# Patient Record
Sex: Female | Born: 1990
Health system: Southern US, Community
[De-identification: ages and names within clinical notes are randomized; demographics above are authoritative.]

## PROBLEM LIST (undated history)

## (undated) HISTORY — PX: TONSILLECTOMY: SUR1361

---
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
## 2011-09-28 ENCOUNTER — Ambulatory Visit (HOSPITAL_COMMUNITY)
Admission: RE | Admit: 2011-09-28 | Discharge: 2011-09-28 | Disposition: A | Discharge status: Home / Self Care | Payer: BC Managed Care – PPO | Source: Ambulatory Visit | Attending: Obstetrics | Admitting: Obstetrics

## 2011-09-28 ENCOUNTER — Other Ambulatory Visit: Payer: Self-pay | Admitting: Obstetrics

## 2011-09-28 DIAGNOSIS — O3680X0 Pregnancy with inconclusive fetal viability, not applicable or unspecified: Secondary | ICD-10-CM

## 2011-09-28 DIAGNOSIS — O368390 Maternal care for abnormalities of the fetal heart rate or rhythm, unspecified trimester, not applicable or unspecified: Secondary | ICD-10-CM | POA: Insufficient documentation

## 2011-09-28 LAB — OB RESULTS CONSOLE ABO/RH

## 2011-09-28 LAB — OB RESULTS CONSOLE HIV ANTIBODY (ROUTINE TESTING): HIV: NONREACTIVE

## 2011-10-01 ENCOUNTER — Other Ambulatory Visit: Payer: Self-pay | Admitting: Obstetrics

## 2011-10-01 DIAGNOSIS — O3680X0 Pregnancy with inconclusive fetal viability, not applicable or unspecified: Secondary | ICD-10-CM

## 2011-10-12 ENCOUNTER — Ambulatory Visit (HOSPITAL_COMMUNITY)
Admission: RE | Admit: 2011-10-12 | Discharge: 2011-10-12 | Disposition: A | Discharge status: Home / Self Care | Payer: BC Managed Care – PPO | Source: Ambulatory Visit | Attending: Obstetrics | Admitting: Obstetrics

## 2011-10-12 DIAGNOSIS — O3680X0 Pregnancy with inconclusive fetal viability, not applicable or unspecified: Secondary | ICD-10-CM | POA: Insufficient documentation

## 2011-10-12 DIAGNOSIS — O26849 Uterine size-date discrepancy, unspecified trimester: Secondary | ICD-10-CM | POA: Insufficient documentation

## 2012-04-20 NOTE — L&D Delivery Note (Signed)
Delivery Note At 9:51 AM a viable female was delivered via Vaginal, Spontaneous Delivery (Presentation: Left Occiput Anterior).  APGAR: , ; weight 7 lb 0.4 oz (3185 g).   Placenta status: Intact, Spontaneous.  Cord: 3 vessels with the following complications: None.  Cord pH: not done  Anesthesia: Epidural  Episiotomy: None Lacerations: None Suture Repair: 2.0 Est. Blood Loss (mL): 250  Mom to postpartum.  Baby to nursery-stable.  Shemicka Cohrs A 05/15/2012, 10:03 AM

## 2012-04-25 LAB — OB RESULTS CONSOLE GC/CHLAMYDIA: Gonorrhea: NEGATIVE

## 2012-05-03 LAB — OB RESULTS CONSOLE GBS: GBS: POSITIVE

## 2012-05-14 ENCOUNTER — Inpatient Hospital Stay (HOSPITAL_COMMUNITY)
Admission: AD | Admit: 2012-05-14 | Discharge: 2012-05-17 | DRG: 373 | Disposition: A | Discharge status: Home / Self Care | Payer: BC Managed Care – PPO | Source: Ambulatory Visit | Attending: Obstetrics & Gynecology | Admitting: Obstetrics & Gynecology

## 2012-05-14 ENCOUNTER — Encounter (HOSPITAL_COMMUNITY): Payer: Self-pay | Admitting: *Deleted

## 2012-05-14 DIAGNOSIS — O99892 Other specified diseases and conditions complicating childbirth: Principal | ICD-10-CM | POA: Diagnosis present

## 2012-05-14 DIAGNOSIS — Z22330 Carrier of Group B streptococcus: Secondary | ICD-10-CM

## 2012-05-14 NOTE — MAU Note (Signed)
I've been contracting since 1800. No bleeding or leaking

## 2012-05-15 ENCOUNTER — Encounter (HOSPITAL_COMMUNITY): Payer: Self-pay | Admitting: *Deleted

## 2012-05-15 ENCOUNTER — Encounter (HOSPITAL_COMMUNITY): Payer: Self-pay | Admitting: Anesthesiology

## 2012-05-15 ENCOUNTER — Inpatient Hospital Stay (HOSPITAL_COMMUNITY): Payer: BC Managed Care – PPO | Admitting: Anesthesiology

## 2012-05-15 LAB — CBC
HCT: 39.4 % (ref 36.0–46.0)
MCHC: 33.8 g/dL (ref 30.0–36.0)
Platelets: 214 10*3/uL (ref 150–400)
RDW: 14 % (ref 11.5–15.5)
WBC: 11 10*3/uL — ABNORMAL HIGH (ref 4.0–10.5)

## 2012-05-15 LAB — TYPE AND SCREEN
ABO/RH(D): O POS
Antibody Screen: NEGATIVE

## 2012-05-15 LAB — RPR: RPR Ser Ql: NONREACTIVE

## 2012-05-15 MED ORDER — LACTATED RINGERS IV SOLN
500.0000 mL | Freq: Once | INTRAVENOUS | Status: AC
  Administered 2012-05-15: 500 mL via INTRAVENOUS

## 2012-05-15 MED ORDER — CITRIC ACID-SODIUM CITRATE 334-500 MG/5ML PO SOLN: 30.0000 mL | ORAL | Status: DC | PRN

## 2012-05-15 MED ORDER — OXYCODONE-ACETAMINOPHEN 5-325 MG PO TABS: 1.0000 | ORAL_TABLET | ORAL | Status: DC | PRN

## 2012-05-15 MED ORDER — SIMETHICONE 80 MG PO CHEW: 80.0000 mg | CHEWABLE_TABLET | ORAL | Status: DC | PRN

## 2012-05-15 MED ORDER — LIDOCAINE HCL (PF) 1 % IJ SOLN
INTRAMUSCULAR | Status: DC | PRN
  Administered 2012-05-15 (×4): 4 mL

## 2012-05-15 MED ORDER — LIDOCAINE HCL (PF) 1 % IJ SOLN
30.0000 mL | INTRAMUSCULAR | Status: DC | PRN
  Filled 2012-05-15: qty 30

## 2012-05-15 MED ORDER — OXYCODONE-ACETAMINOPHEN 5-325 MG PO TABS
1.0000 | ORAL_TABLET | ORAL | Status: DC | PRN
  Administered 2012-05-17: 1 via ORAL
  Filled 2012-05-15 (×2): qty 1

## 2012-05-15 MED ORDER — TETANUS-DIPHTH-ACELL PERTUSSIS 5-2.5-18.5 LF-MCG/0.5 IM SUSP
0.5000 mL | Freq: Once | INTRAMUSCULAR | Status: AC
  Administered 2012-05-16: 0.5 mL via INTRAMUSCULAR

## 2012-05-15 MED ORDER — ONDANSETRON HCL 4 MG PO TABS: 4.0000 mg | ORAL_TABLET | ORAL | Status: DC | PRN

## 2012-05-15 MED ORDER — FENTANYL 2.5 MCG/ML BUPIVACAINE 1/10 % EPIDURAL INFUSION (WH - ANES)
14.0000 mL/h | INTRAMUSCULAR | Status: DC
  Administered 2012-05-15: 14 mL/h via EPIDURAL
  Filled 2012-05-15: qty 125

## 2012-05-15 MED ORDER — BUTORPHANOL TARTRATE 1 MG/ML IJ SOLN
1.0000 mg | INTRAMUSCULAR | Status: DC | PRN
  Administered 2012-05-15 (×2): 1 mg via INTRAVENOUS
  Filled 2012-05-15 (×2): qty 1

## 2012-05-15 MED ORDER — ZOLPIDEM TARTRATE 5 MG PO TABS: 5.0000 mg | ORAL_TABLET | Freq: Every evening | ORAL | Status: DC | PRN

## 2012-05-15 MED ORDER — ONDANSETRON HCL 4 MG/2ML IJ SOLN: 4.0000 mg | INTRAMUSCULAR | Status: DC | PRN

## 2012-05-15 MED ORDER — ONDANSETRON HCL 4 MG/2ML IJ SOLN: 4.0000 mg | Freq: Four times a day (QID) | INTRAMUSCULAR | Status: DC | PRN

## 2012-05-15 MED ORDER — DIPHENHYDRAMINE HCL 25 MG PO CAPS: 25.0000 mg | ORAL_CAPSULE | Freq: Four times a day (QID) | ORAL | Status: DC | PRN

## 2012-05-15 MED ORDER — DIPHENHYDRAMINE HCL 50 MG/ML IJ SOLN: 12.5000 mg | INTRAMUSCULAR | Status: DC | PRN

## 2012-05-15 MED ORDER — LANOLIN HYDROUS EX OINT: TOPICAL_OINTMENT | CUTANEOUS | Status: DC | PRN

## 2012-05-15 MED ORDER — SENNOSIDES-DOCUSATE SODIUM 8.6-50 MG PO TABS
2.0000 | ORAL_TABLET | Freq: Every day | ORAL | Status: DC
  Administered 2012-05-16 (×2): 2 via ORAL

## 2012-05-15 MED ORDER — EPHEDRINE 5 MG/ML INJ
10.0000 mg | INTRAVENOUS | Status: DC | PRN
  Filled 2012-05-15: qty 4

## 2012-05-15 MED ORDER — PENICILLIN G POTASSIUM 5000000 UNITS IJ SOLR
2.5000 10*6.[IU] | INTRAVENOUS | Status: DC
  Administered 2012-05-15: 2.5 10*6.[IU] via INTRAVENOUS
  Filled 2012-05-15 (×3): qty 2.5

## 2012-05-15 MED ORDER — PRENATAL MULTIVITAMIN CH
1.0000 | ORAL_TABLET | Freq: Every day | ORAL | Status: DC
  Administered 2012-05-15 – 2012-05-17 (×3): 1 via ORAL
  Filled 2012-05-15 (×3): qty 1

## 2012-05-15 MED ORDER — LACTATED RINGERS IV SOLN
500.0000 mL | INTRAVENOUS | Status: DC | PRN
  Administered 2012-05-15: 500 mL via INTRAVENOUS

## 2012-05-15 MED ORDER — LACTATED RINGERS IV SOLN
INTRAVENOUS | Status: DC
  Administered 2012-05-15 (×2): via INTRAVENOUS

## 2012-05-15 MED ORDER — EPHEDRINE 5 MG/ML INJ: 10.0000 mg | INTRAVENOUS | Status: DC | PRN

## 2012-05-15 MED ORDER — WITCH HAZEL-GLYCERIN EX PADS: 1.0000 "application " | MEDICATED_PAD | CUTANEOUS | Status: DC | PRN

## 2012-05-15 MED ORDER — IBUPROFEN 600 MG PO TABS
600.0000 mg | ORAL_TABLET | Freq: Four times a day (QID) | ORAL | Status: DC | PRN
  Filled 2012-05-15 (×7): qty 1

## 2012-05-15 MED ORDER — DIBUCAINE 1 % RE OINT: 1.0000 "application " | TOPICAL_OINTMENT | RECTAL | Status: DC | PRN

## 2012-05-15 MED ORDER — BENZOCAINE-MENTHOL 20-0.5 % EX AERO: 1.0000 "application " | INHALATION_SPRAY | CUTANEOUS | Status: DC | PRN

## 2012-05-15 MED ORDER — OXYTOCIN BOLUS FROM INFUSION: 500.0000 mL | INTRAVENOUS | Status: DC

## 2012-05-15 MED ORDER — FERROUS SULFATE 325 (65 FE) MG PO TABS
325.0000 mg | ORAL_TABLET | Freq: Two times a day (BID) | ORAL | Status: DC
  Administered 2012-05-15 – 2012-05-17 (×4): 325 mg via ORAL
  Filled 2012-05-15 (×5): qty 1

## 2012-05-15 MED ORDER — PENICILLIN G POTASSIUM 5000000 UNITS IJ SOLR
5.0000 10*6.[IU] | Freq: Once | INTRAVENOUS | Status: AC
  Administered 2012-05-15: 5 10*6.[IU] via INTRAVENOUS
  Filled 2012-05-15: qty 5

## 2012-05-15 MED ORDER — ACETAMINOPHEN 325 MG PO TABS: 650.0000 mg | ORAL_TABLET | ORAL | Status: DC | PRN

## 2012-05-15 MED ORDER — IBUPROFEN 600 MG PO TABS
600.0000 mg | ORAL_TABLET | Freq: Four times a day (QID) | ORAL | Status: DC
  Administered 2012-05-15 – 2012-05-17 (×8): 600 mg via ORAL
  Filled 2012-05-15 (×2): qty 1

## 2012-05-15 MED ORDER — OXYTOCIN 40 UNITS IN LACTATED RINGERS INFUSION - SIMPLE MED
62.5000 mL/h | INTRAVENOUS | Status: DC
  Filled 2012-05-15: qty 1000

## 2012-05-15 MED ORDER — PHENYLEPHRINE 40 MCG/ML (10ML) SYRINGE FOR IV PUSH (FOR BLOOD PRESSURE SUPPORT): 80.0000 ug | PREFILLED_SYRINGE | INTRAVENOUS | Status: DC | PRN

## 2012-05-15 MED ORDER — PHENYLEPHRINE 40 MCG/ML (10ML) SYRINGE FOR IV PUSH (FOR BLOOD PRESSURE SUPPORT)
80.0000 ug | PREFILLED_SYRINGE | INTRAVENOUS | Status: DC | PRN
  Filled 2012-05-15: qty 5

## 2012-05-15 NOTE — MAU Note (Signed)
Notified Dr. Gaynell Face patient of FEMINA G1 [redacted]w[redacted]d labor 3/70/-2 bulging bag, GBS positive.

## 2012-05-15 NOTE — Anesthesia Postprocedure Evaluation (Signed)
Anesthesia Post Note  Patient: Evelyn Kennedy  Procedure(s) Performed: * No procedures listed *  Anesthesia type: Epidural  Patient location: Mother/Baby  Post pain: Pain level controlled  Post assessment: Post-op Vital signs reviewed  Last Vitals:  Filed Vitals:   05/15/12 1235  BP: 130/78  Pulse: 73  Temp: 36.6 C  Resp: 20    Post vital signs: Reviewed  Level of consciousness:alert  Complications: No apparent anesthesia complications

## 2012-05-15 NOTE — Anesthesia Preprocedure Evaluation (Signed)

## 2012-05-15 NOTE — Anesthesia Procedure Notes (Signed)
Epidural Patient location during procedure: OB Start time: 05/15/2012 6:59 AM  Staffing Performed by: anesthesiologist   Preanesthetic Checklist Completed: patient identified, site marked, surgical consent, pre-op evaluation, timeout performed, IV checked, risks and benefits discussed and monitors and equipment checked  Epidural Patient position: sitting Prep: site prepped and draped and DuraPrep Patient monitoring: continuous pulse ox and blood pressure Approach: midline Injection technique: LOR air  Needle:  Needle type: Tuohy  Needle gauge: 17 G Needle length: 9 cm and 9 Needle insertion depth: 6 cm Catheter type: closed end flexible Catheter size: 19 Gauge Catheter at skin depth: 11 cm Test dose: negative  Assessment Events: blood not aspirated, injection not painful, no injection resistance, negative IV test and no paresthesia  Additional Notes Discussed risk of headache, infection, bleeding, nerve injury and failed or incomplete block.  Patient voices understanding and wishes to proceed.  Epidural placed easily on first attempt.  No paresthesia.  Some heme in catheter, but cleared with flushing.  Test dose negative.  Patient tolerated procedure well, no apparent complications.  Jasmine December, MDReason for block:procedure for pain

## 2012-05-15 NOTE — H&P (Signed)
This is Dr. Francoise Ceo dictating the history and physical on  Evelyn Kennedy she's a 22 year old gravida 1 at 67 weeks and 2 days EDC 05/20/2012 in labor positive GBS treated with penicillin cervix no 5 cm 100% vertex -2-3 station amniotomy performed the fluids clear patient is having irregular contractions Past medical history negative Past surgical history negative Social history negative System review noncontributory Physical exam well-developed female in labor HEENT negative Lungs clear to P&A Heart regular rhythm no murmurs no gallops Breasts negative Abdomen term Pelvic as described above Extremities negative and

## 2012-05-16 LAB — CBC
MCV: 89.6 fL (ref 78.0–100.0)
Platelets: 184 10*3/uL (ref 150–400)
RBC: 3.96 MIL/uL (ref 3.87–5.11)
RDW: 13.9 % (ref 11.5–15.5)
WBC: 12.6 10*3/uL — ABNORMAL HIGH (ref 4.0–10.5)

## 2012-05-16 NOTE — Progress Notes (Signed)
Post Partum Day 1 Subjective: no complaints  Objective: Blood pressure 104/71, pulse 114, temperature 98.2 F (36.8 C), temperature source Axillary, resp. rate 18, height 5\' 2"  (1.575 m), weight 198 lb (89.812 kg), SpO2 100.00%, unknown if currently breastfeeding.  Physical Exam:  General: alert and no distress Lochia: appropriate Uterine Fundus: firm Incision: healing well DVT Evaluation: No evidence of DVT seen on physical exam.   Basename 05/16/12 0550 05/15/12 0130  HGB 11.7* 13.3  HCT 35.5* 39.4    Assessment/Plan: Plan for discharge tomorrow   LOS: 2 days   Heily Carlucci A 05/16/2012, 8:18 AM

## 2012-05-17 MED ORDER — OXYCODONE-ACETAMINOPHEN 5-325 MG PO TABS: 1.0000 | ORAL_TABLET | ORAL | Status: DC | PRN

## 2012-05-17 MED ORDER — IBUPROFEN 600 MG PO TABS: 600.0000 mg | ORAL_TABLET | Freq: Four times a day (QID) | ORAL | Status: DC | PRN

## 2012-05-17 NOTE — Progress Notes (Signed)
Post Partum Day 2 Subjective: no complaints  Objective: Blood pressure 129/83, pulse 88, temperature 98.2 F (36.8 C), temperature source Oral, resp. rate 18, height 5\' 2"  (1.575 m), weight 198 lb (89.812 kg), SpO2 98.00%, unknown if currently breastfeeding.  Physical Exam:  General: alert and no distress Lochia: appropriate Uterine Fundus: firm Incision: healing well DVT Evaluation: No evidence of DVT seen on physical exam.   Basename 05/16/12 0550 05/15/12 0130  HGB 11.7* 13.3  HCT 35.5* 39.4    Assessment/Plan: Discharge home   LOS: 3 days   HARPER,CHARLES A 05/17/2012, 9:23 AM

## 2012-05-17 NOTE — Discharge Summary (Signed)
Obstetric Discharge Summary Reason for Admission: onset of labor Prenatal Procedures: ultrasound Intrapartum Procedures: spontaneous vaginal delivery Postpartum Procedures: none Complications-Operative and Postpartum: none Hemoglobin  Date Value Range Status  05/16/2012 11.7* 12.0 - 15.0 g/dL Final     HCT  Date Value Range Status  05/16/2012 35.5* 36.0 - 46.0 % Final    Physical Exam:  General: alert and no distress Lochia: appropriate Uterine Fundus: firm Incision: healing well DVT Evaluation: No evidence of DVT seen on physical exam.  Discharge Diagnoses: Term Pregnancy-delivered  Discharge Information: Date: 05/17/2012 Activity: pelvic rest Diet: routine Medications: PNV, Ibuprofen, Colace and Percocet Condition: stable Instructions: refer to practice specific booklet Discharge to: home Follow-up Information    Follow up with Antionette Char A, MD. Schedule an appointment as soon as possible for a visit in 6 weeks.   Contact information:   [address], [address] [place] [address] [phone]          Newborn Data: Live born female  Birth Weight: 7 lb 0.3 oz (3184 g) APGAR: 7, 9  Home with mother.  HARPER,CHARLES A 05/17/2012, 9:35 AM

## 2013-04-11 ENCOUNTER — Encounter: Payer: Self-pay | Admitting: Advanced Practice Midwife

## 2013-04-11 ENCOUNTER — Ambulatory Visit (INDEPENDENT_AMBULATORY_CARE_PROVIDER_SITE_OTHER): Payer: BC Managed Care – PPO | Admitting: Advanced Practice Midwife

## 2013-04-11 VITALS — BP 158/78 | HR 91 | Temp 98.2°F | Ht 62.0 in | Wt 159.0 lb

## 2013-04-11 DIAGNOSIS — Z3480 Encounter for supervision of other normal pregnancy, unspecified trimester: Secondary | ICD-10-CM

## 2013-04-11 DIAGNOSIS — N926 Irregular menstruation, unspecified: Secondary | ICD-10-CM

## 2013-04-11 NOTE — Progress Notes (Signed)
  Subjective:     Evelyn Kennedy is a 22 y.o. female and is here for a comprehensive physical exam. The patient reports no problems.  Here today for annual but thinks she is pregnant   History   Social History  . Marital Status: Single    Spouse Name: N/A    Number of Children: N/A  . Years of Education: N/A   Occupational History  . Not on file.   Social History Main Topics  . Smoking status: Never Smoker   . Smokeless tobacco: Not on file  . Alcohol Use: No  . Drug Use: No  . Sexual Activity: Yes    Birth Control/ Protection: None   Other Topics Concern  . Not on file   Social History Narrative  . No narrative on file   No health maintenance topics applied.  The following portions of the patient's history were reviewed and updated as appropriate: allergies, current medications, past family history, past medical history, past social history, past surgical history and problem list.  Review of Systems A comprehensive review of systems was negative.   Objective:    BP 158/78  Pulse 91  Temp(Src) 98.2 F (36.8 C)  Ht 5\' 2"  (1.575 m)  Wt 159 lb (72.122 kg)  BMI 29.07 kg/m2  LMP 02/07/2013  Breastfeeding? No General appearance: alert and cooperative Head: Normocephalic, without obvious abnormality, atraumatic Eyes: conjunctivae/corneas clear. PERRL, EOM's intact. Fundi benign. Ears: normal TM's and external ear canals both ears Nose: Nares normal. Septum midline. Mucosa normal. No drainage or sinus tenderness. Throat: lips, mucosa, and tongue normal; teeth and gums normal Neck: no adenopathy, no carotid bruit, no JVD, supple, symmetrical, trachea midline and thyroid not enlarged, symmetric, no tenderness/mass/nodules Back: negative, symmetric, no curvature. ROM normal. No CVA tenderness. Lungs: clear to auscultation bilaterally Breasts: normal appearance, no masses or tenderness Heart: regular rate and rhythm, S1, S2 normal, no murmur, click, rub or  gallop Abdomen: soft, non-tender; bowel sounds normal; no masses,  no organomegaly Pelvic: cervix normal in appearance, external genitalia normal, no adnexal masses or tenderness, no cervical motion tenderness, rectovaginal septum normal, uterus normal size, shape, and consistency and vagina normal without discharge Extremities: extremities normal, atraumatic, no cyanosis or edema Pulses: 2+ and symmetric Skin: Skin color, texture, turgor normal. No rashes or lesions Lymph nodes: Cervical, supraclavicular, and axillary nodes normal. Neurologic: Grossly normal    Assessment:    Healthy female exam.  Pregnant   There are no active problems to display for this patient. Closely spaced pregnancy    Plan:    NOB labs today NOB education done Patient to RTC for [place] to confirm dating  RTC in 4 weeks for ROB See After Visit Summary for Counseling Recommendations  Reviewed warning signs in pregnancy and reviewed education Give patient NOB info and handouts NV  40 min spent with patient greater than 80% spent in counseling and coordination of care.   Caprice Mccaffrey Wilson Singer CNM

## 2013-04-11 NOTE — Progress Notes (Signed)
Pt is in office for annual exam. Pt would like UPT and STD testing.  Pt has been having irregular cycles since delivery.  Pt is having some light headaches and is concerned about her diet.

## 2013-04-12 LAB — OBSTETRIC PANEL
Basophils Absolute: 0 10*3/uL (ref 0.0–0.1)
Basophils Relative: 0 % (ref 0–1)
Eosinophils Absolute: 0.1 10*3/uL (ref 0.0–0.7)
Eosinophils Relative: 1 % (ref 0–5)
Lymphs Abs: 2.8 10*3/uL (ref 0.7–4.0)
MCH: 28.8 pg (ref 26.0–34.0)
MCHC: 33.6 g/dL (ref 30.0–36.0)
MCV: 85.7 fL (ref 78.0–100.0)
Platelets: 327 10*3/uL (ref 150–400)
RDW: 14.4 % (ref 11.5–15.5)

## 2013-04-12 LAB — HIV ANTIBODY (ROUTINE TESTING W REFLEX): HIV: NONREACTIVE

## 2013-04-12 LAB — GC/CHLAMYDIA PROBE AMP: GC Probe RNA: NEGATIVE

## 2013-04-13 LAB — CULTURE, OB URINE: Colony Count: NO GROWTH

## 2013-04-14 LAB — HEMOGLOBINOPATHY EVALUATION
Hgb A2 Quant: 2.7 % (ref 2.2–3.2)
Hgb A: 97.3 % (ref 96.8–97.8)
Hgb S Quant: 0 %

## 2013-04-24 ENCOUNTER — Encounter: Payer: BC Managed Care – PPO | Admitting: Advanced Practice Midwife

## 2013-04-24 ENCOUNTER — Other Ambulatory Visit: Payer: Self-pay | Admitting: *Deleted

## 2013-04-24 DIAGNOSIS — O3680X1 Pregnancy with inconclusive fetal viability, fetus 1: Secondary | ICD-10-CM

## 2013-04-25 ENCOUNTER — Other Ambulatory Visit: Payer: BC Managed Care – PPO

## 2013-04-25 ENCOUNTER — Encounter: Payer: BC Managed Care – PPO | Admitting: Advanced Practice Midwife

## 2013-06-07 ENCOUNTER — Other Ambulatory Visit: Payer: Self-pay | Admitting: *Deleted

## 2013-06-07 DIAGNOSIS — Z309 Encounter for contraceptive management, unspecified: Secondary | ICD-10-CM

## 2013-06-07 MED ORDER — LO LOESTRIN FE 1 MG-10 MCG / 10 MCG PO TABS: 1.0000 | ORAL_TABLET | Freq: Every day | ORAL | Status: DC

## 2013-08-03 ENCOUNTER — Encounter: Payer: Self-pay | Admitting: Advanced Practice Midwife

## 2013-11-11 IMAGING — US US OB TRANSVAGINAL
1 series · 13 of 28 positions shown · non-contrast
Comparison: none

[Series 1: us ob comp less 14 wks · 56 acquisitions, 13 frames shown]
[im 3/56]
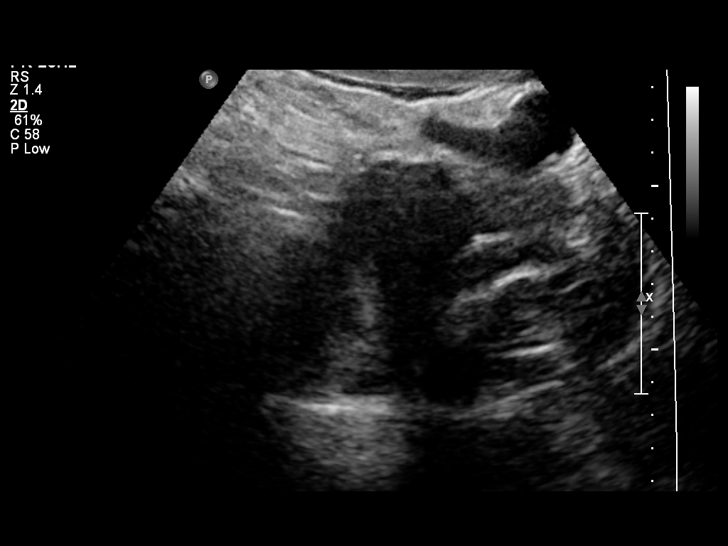
[im 7/56]
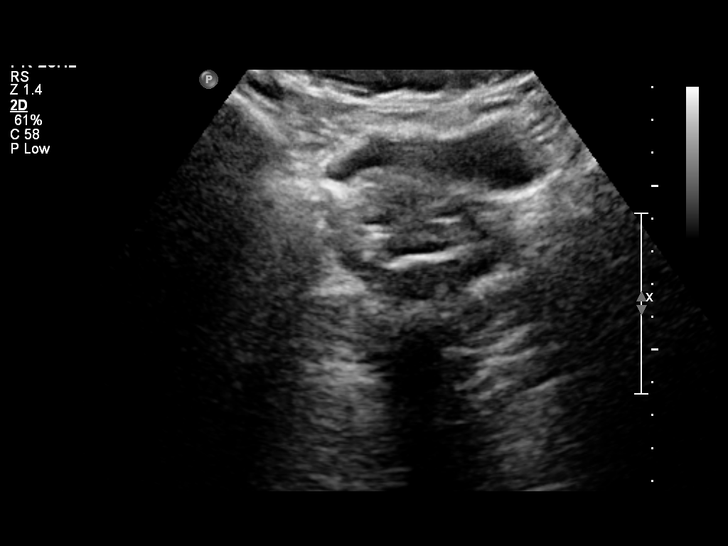
[im 11/56]
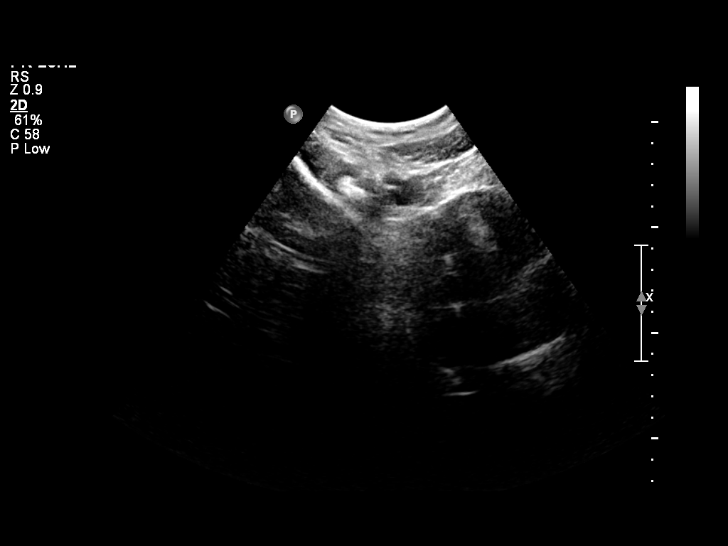
[im 15/56]
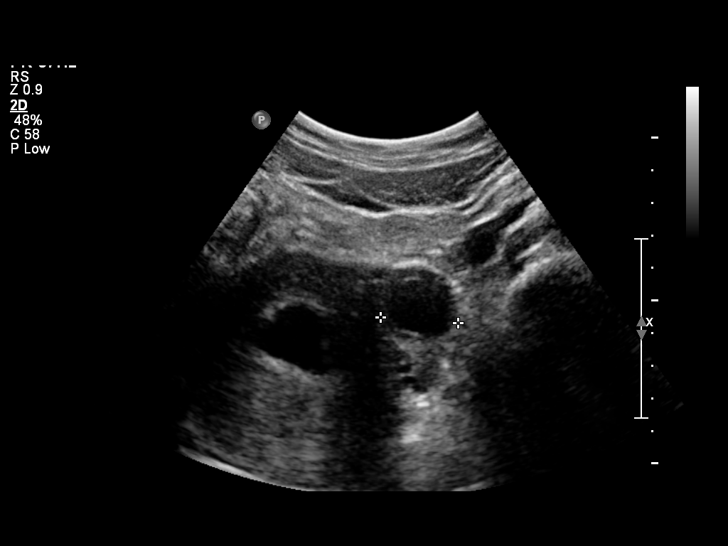
[im 19/56]
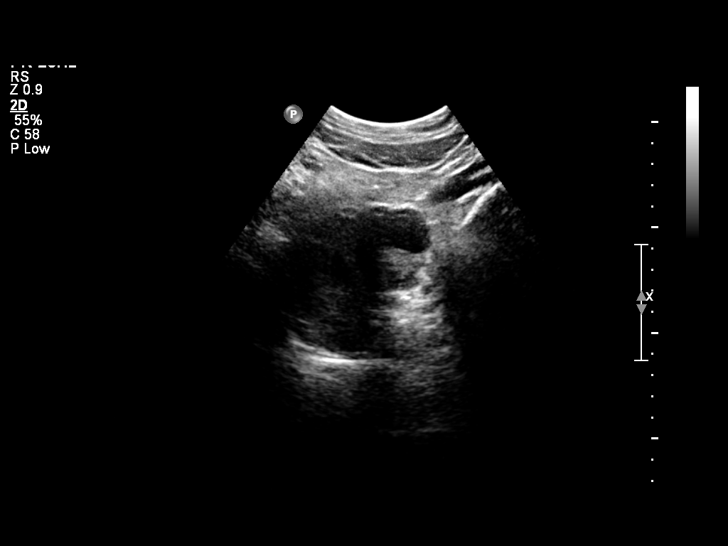
[im 23/56]
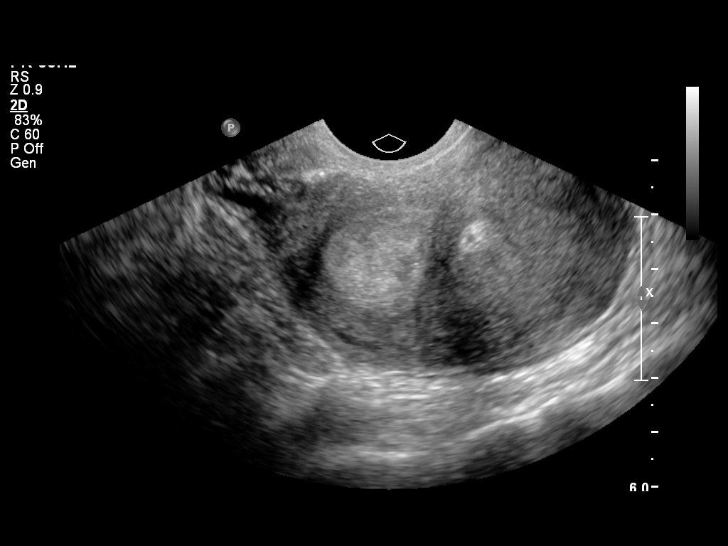
[im 29/56]
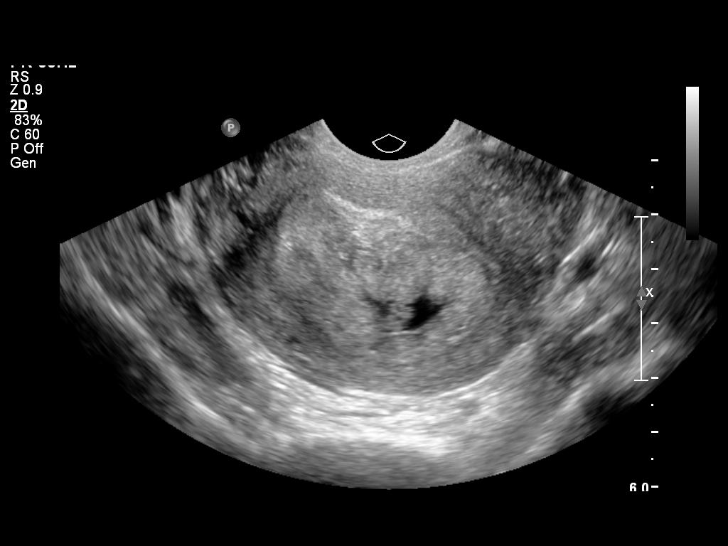
[im 33/56]
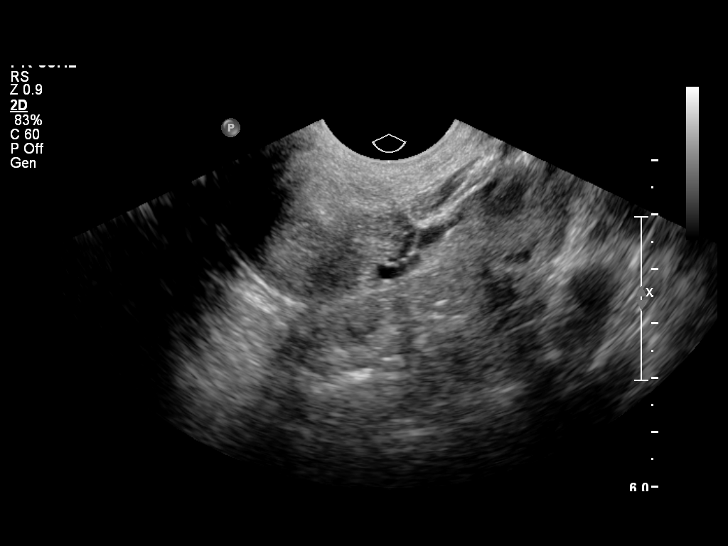
[im 37/56]
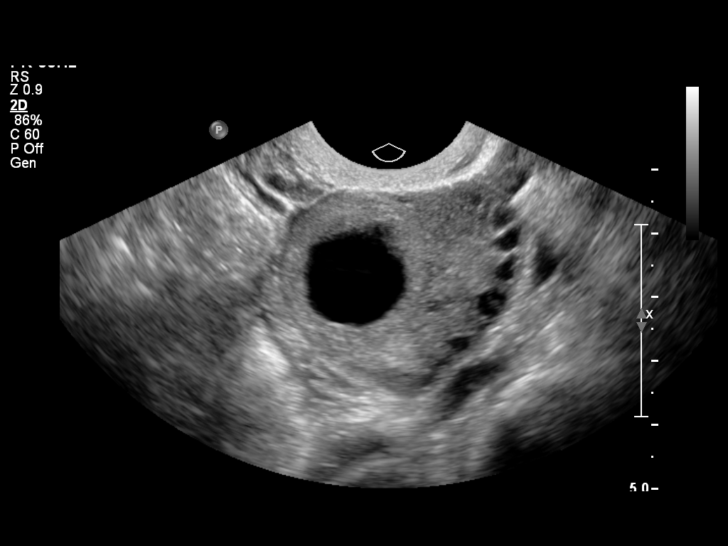
[im 41/56]
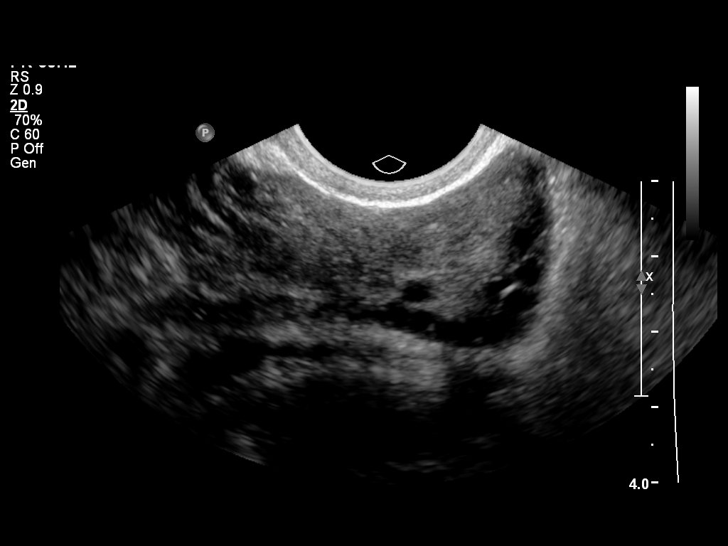
[im 45/56]
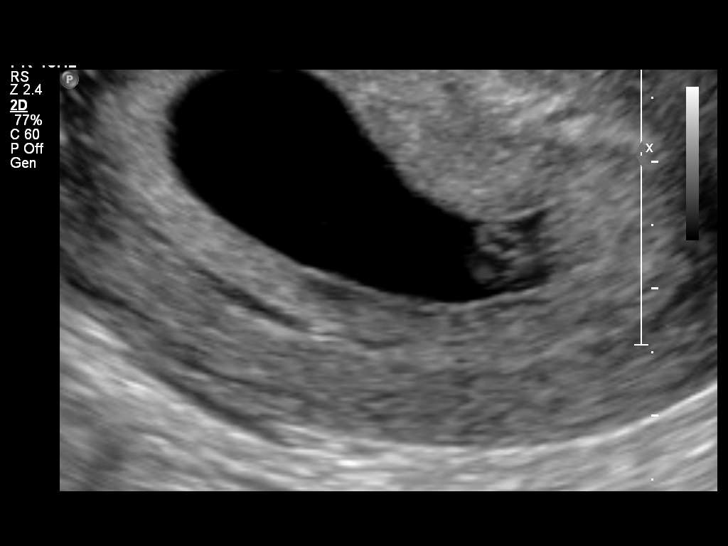
[im 49/56]
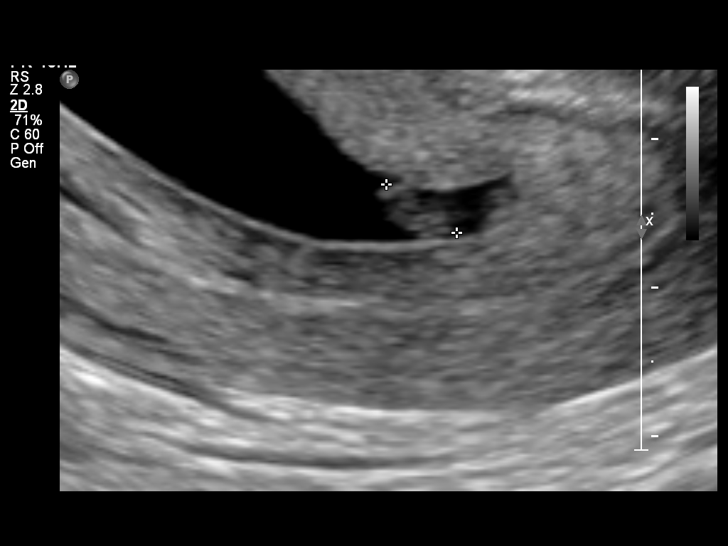
[im 53/56]
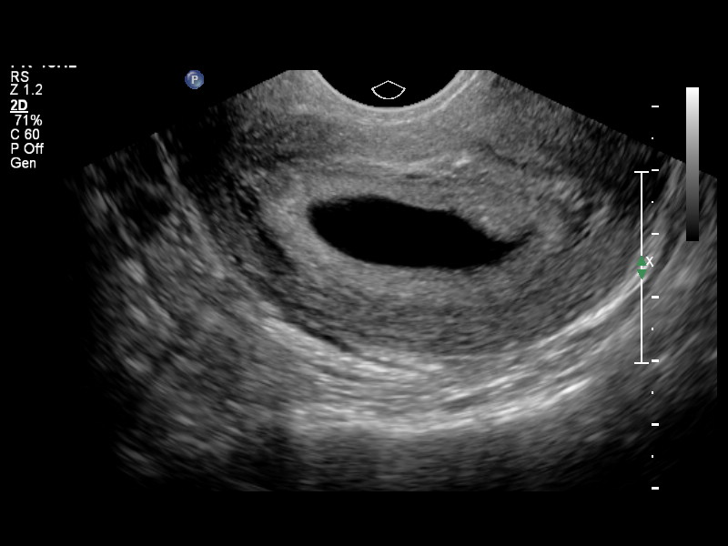

[13 of 28 positions shown; findings below may reference images not displayed]

OBSTETRICS REPORT
                      (Signed Final 09/28/2011 [DATE])

 Order#:         [id number]
                 15_O
Procedures

 US OB TRANSVAGINAL                                    76817.0
 US OB COMP LESS 14 WKS                                76801.0
Indications

 Pregnancy with inconclusive fetal viability
 No fetal cardiac activity detected
Fetal Evaluation

 Preg. Location:    Intrauterine
 Gest. Sac:         Intrauterine, small
                    subchorionic bleed
 Yolk Sac:          Visualized
 Fetal Pole:        Visualized
 Fetal Heart Rate:  125                         bpm
 Cardiac Activity:  Observed
Biometry

 CRL:        6  mm    G. Age:   6w 3d                  EDD:   05/20/12
Gestational Age

 LMP:           13w 4d       Date:   06/25/11                 EDD:   03/31/12
 Best:          6w 3d     Det. By:   U/S C R L (09/28/11)     EDD:   05/20/12
Cervix Uterus Adnexa

 Cervix:       Closed.
 Uterus:       Subchorionic collection, see comments.
 Cul De Sac:   No free fluid seen.

 Left Ovary:   Within normal limits. Small corpus luteum noted.
 Right Ovary:  Within normal limits.
 Adnexa:     No abnormality visualized.
Impression

 Single living intrauterine embryo. The estimated gestational
 age is 6w 3d based on U/S C R L (09/28/11). Subjectively the
 gestational sac appears large and somewhat irregular.
 Recommend follow-up.  GA by LMP differs from GA by US.
 questions or concerns.

## 2014-02-19 ENCOUNTER — Encounter: Payer: Self-pay | Admitting: Advanced Practice Midwife

## 2014-07-11 ENCOUNTER — Encounter: Payer: Self-pay | Admitting: Certified Nurse Midwife

## 2014-07-11 ENCOUNTER — Ambulatory Visit (INDEPENDENT_AMBULATORY_CARE_PROVIDER_SITE_OTHER): Payer: BLUE CROSS/BLUE SHIELD | Admitting: Certified Nurse Midwife

## 2014-07-11 ENCOUNTER — Other Ambulatory Visit: Payer: Self-pay | Admitting: Certified Nurse Midwife

## 2014-07-11 VITALS — BP 136/87 | HR 105 | Temp 99.0°F | Ht 62.0 in | Wt 151.0 lb

## 2014-07-11 DIAGNOSIS — Z793 Long term (current) use of hormonal contraceptives: Secondary | ICD-10-CM

## 2014-07-11 DIAGNOSIS — Z79899 Other long term (current) drug therapy: Secondary | ICD-10-CM

## 2014-07-11 DIAGNOSIS — Z01419 Encounter for gynecological examination (general) (routine) without abnormal findings: Secondary | ICD-10-CM

## 2014-07-11 DIAGNOSIS — Z124 Encounter for screening for malignant neoplasm of cervix: Secondary | ICD-10-CM | POA: Diagnosis not present

## 2014-07-11 DIAGNOSIS — N912 Amenorrhea, unspecified: Secondary | ICD-10-CM

## 2014-07-11 DIAGNOSIS — Z3202 Encounter for pregnancy test, result negative: Secondary | ICD-10-CM

## 2014-07-11 LAB — CBC WITH DIFFERENTIAL/PLATELET
BASOS PCT: 0 % (ref 0–1)
Basophils Absolute: 0 10*3/uL (ref 0.0–0.1)
EOS ABS: 0 10*3/uL (ref 0.0–0.7)
EOS PCT: 0 % (ref 0–5)
HEMATOCRIT: 44 % (ref 36.0–46.0)
Hemoglobin: 14.6 g/dL (ref 12.0–15.0)
Lymphocytes Relative: 43 % (ref 12–46)
Lymphs Abs: 2.3 10*3/uL (ref 0.7–4.0)
MCH: 28.9 pg (ref 26.0–34.0)
MCHC: 33.2 g/dL (ref 30.0–36.0)
MCV: 87.1 fL (ref 78.0–100.0)
MONO ABS: 0.4 10*3/uL (ref 0.1–1.0)
MONOS PCT: 7 % (ref 3–12)
MPV: 10.3 fL (ref 8.6–12.4)
NEUTROS ABS: 2.7 10*3/uL (ref 1.7–7.7)
Neutrophils Relative %: 50 % (ref 43–77)
PLATELETS: 336 10*3/uL (ref 150–400)
RBC: 5.05 MIL/uL (ref 3.87–5.11)
RDW: 14 % (ref 11.5–15.5)
WBC: 5.3 10*3/uL (ref 4.0–10.5)

## 2014-07-11 LAB — COMPREHENSIVE METABOLIC PANEL WITH GFR
ALT: 20 U/L (ref 0–35)
AST: 19 U/L (ref 0–37)
Albumin: 4.8 g/dL (ref 3.5–5.2)
Alkaline Phosphatase: 47 U/L (ref 39–117)
BUN: 15 mg/dL (ref 6–23)
CO2: 23 meq/L (ref 19–32)
Calcium: 9.9 mg/dL (ref 8.4–10.5)
Chloride: 104 meq/L (ref 96–112)
Creat: 0.92 mg/dL (ref 0.50–1.10)
Glucose, Bld: 90 mg/dL (ref 70–99)
Potassium: 4.6 meq/L (ref 3.5–5.3)
Sodium: 139 meq/L (ref 135–145)
Total Bilirubin: 0.7 mg/dL (ref 0.2–1.2)
Total Protein: 7.7 g/dL (ref 6.0–8.3)

## 2014-07-11 LAB — POCT URINE PREGNANCY: Preg Test, Ur: NEGATIVE

## 2014-07-11 MED ORDER — LO LOESTRIN FE 1 MG-10 MCG / 10 MCG PO TABS: 1.0000 | ORAL_TABLET | Freq: Every day | ORAL | Status: DC

## 2014-07-11 NOTE — Progress Notes (Signed)
Patient ID: Evelyn EdisBrilynn Hopke, female   DOB: 08/22/1990, 24 y.o.   MRN: [id number]   Subjective:     Evelyn Kennedy is a 24 y.o. female here for a routine exam.  Current complaints: currently using contraception and sexually active.  Has irregular periods/cycles; last LMP was 3 months ago for the first time in many years that she has gone 3 months without a period, other times has been a shorter duration of around 6 weeks.        Personal health questionnaire:  Is patient Ashkenazi Jewish, have a family history of breast and/or ovarian cancer: no Is there a family history of uterine cancer diagnosed at age < 6650, gastrointestinal cancer, urinary tract cancer, family member who is a Personnel officerLynch syndrome-associated carrier: no Is the patient overweight and hypertensive, family history of diabetes, personal history of gestational diabetes, preeclampsia or PCOS: no Is patient over 6555, have PCOS,  family history of premature CHD under age 24, diabetes, smoke, have hypertension or peripheral artery disease:  no At any time, has a partner hit, kicked or otherwise hurt or frightened you?: no Over the past 2 weeks, have you felt down, depressed or hopeless?: no Over the past 2 weeks, have you felt little interest or pleasure in doing things?:no   Gynecologic History No LMP recorded. Patient is not currently having periods (Reason: Other). Contraception: OCP (estrogen/progesterone) Last Pap: unknown. Last mammogram: N/A.   Obstetric History OB History  Gravida Para Term Preterm AB SAB TAB Ectopic Multiple Living  1 1 1       1     # Outcome Date GA Lbr Len/2nd Weight Sex Delivery Anes PTL Lv  1 Term 05/15/12 4660w2d -11:49 / 00:40 3.184 kg (7 lb 0.3 oz) F Vag-Spont EPI  Y      History reviewed. No pertinent past medical history.  Past Surgical History  Procedure Laterality Date  . Tonsillectomy       Current outpatient prescriptions:  .  ibuprofen (ADVIL,MOTRIN) 600 MG tablet, Take 1 tablet (600 mg  total) by mouth every 6 (six) hours as needed for pain (pain scale < 4)., Disp: 30 tablet, Rfl: 5 .  LO LOESTRIN FE 1 MG-10 MCG / 10 MCG tablet, Take 1 tablet by mouth daily., Disp: 1 Package, Rfl: 11 No Known Allergies  History  Substance Use Topics  . Smoking status: Never Smoker   . Smokeless tobacco: Not on file  . Alcohol Use: No    Family History  Problem Relation Age of Onset  . Alcohol abuse Neg Hx   . Arthritis Neg Hx   . Asthma Neg Hx   . Birth defects Neg Hx   . Cancer Neg Hx   . COPD Neg Hx   . Depression Neg Hx   . Diabetes Neg Hx   . Drug abuse Neg Hx   . Early death Neg Hx   . Hearing loss Neg Hx   . Heart disease Neg Hx   . Hyperlipidemia Neg Hx   . Hypertension Neg Hx   . Kidney disease Neg Hx   . Learning disabilities Neg Hx   . Mental illness Neg Hx   . Mental retardation Neg Hx       Review of Systems  Constitutional: negative for fatigue and weight loss Respiratory: negative for cough and wheezing Cardiovascular: negative for chest pain, fatigue and palpitations Gastrointestinal: negative for abdominal pain and change in bowel habits Musculoskeletal:negative for myalgias Neurological: negative for gait problems and  tremors Behavioral/Psych: negative for abusive relationship, depression Endocrine: negative for temperature intolerance   Genitourinary:negative for abnormal menstrual periods, genital lesions, hot flashes, sexual problems and vaginal discharge Integument/breast: negative for breast lump, breast tenderness, nipple discharge and skin lesion(s)    Objective:       BP 136/87 mmHg  Pulse 105  Temp(Src) 99 F (37.2 C)  Ht  (1.575 m)  Wt 68.493 kg (151 lb)  BMI 27.61 kg/m2 General:   alert  Skin:   no rash or abnormalities  Lungs:   clear to auscultation bilaterally  Heart:   regular rate and rhythm, S1, S2 normal, no murmur, click, rub or gallop  Breasts:   normal without suspicious masses, skin or nipple changes or axillary  nodes  Abdomen:  normal findings: no organomegaly, soft, non-tender and no hernia  Pelvis:  External genitalia: normal general appearance Urinary system: urethral meatus normal and bladder without fullness, nontender Vaginal: normal without tenderness, induration or masses Cervix: normal appearance Adnexa: normal bimanual exam Uterus: anteverted and non-tender, normal size   Lab Review Urine pregnancy test Labs reviewed yes Radiologic studies reviewed no    Assessment:    Healthy female exam.   Contraception Counseling Amenorrhea d/t OCP use   Plan:    Education reviewed: depression evaluation, low fat, low cholesterol diet, safe sex/STD prevention, self breast exams, skin cancer screening and weight bearing exercise. Contraception: OCP (estrogen/progesterone). Follow up in: 3 months.   Meds ordered this encounter  Medications  . LO LOESTRIN FE 1 MG-10 MCG / 10 MCG tablet    Sig: Take 1 tablet by mouth daily.    Dispense:  1 Package    Refill:  11   Orders Placed This Encounter  Procedures  . SureSwab, Vaginosis/Vaginitis Plus  . US Transvaginal Non-OB    Standing Status: Future     Number of Occurrences:      Standing Expiration Date: 09/10/2015    Order Specific Question:  Reason for Exam (SYMPTOM  OR DIAGNOSIS REQUIRED)    Answer:  Amnenorrhea    Order Specific Question:  Preferred imaging location?    Answer:  Internal  . HIV antibody (with reflex)  . Hepatitis B surface antigen  . RPR  . Hepatitis C antibody  . CBC with Differential/Platelet  . Comprehensive metabolic panel  . TSH  . Prolactin  . Follicle stimulating hormone  . POCT urine pregnancy

## 2014-07-12 LAB — PAP IG W/ RFLX HPV ASCU

## 2014-07-12 LAB — RPR

## 2014-07-12 LAB — HEPATITIS B SURFACE ANTIGEN: Hepatitis B Surface Ag: NEGATIVE

## 2014-07-12 LAB — TSH: TSH: 1.764 u[IU]/mL (ref 0.350–4.500)

## 2014-07-12 LAB — PROLACTIN: PROLACTIN: 5.9 ng/mL

## 2014-07-12 LAB — HEPATITIS C ANTIBODY: HCV AB: NEGATIVE

## 2014-07-12 LAB — HIV ANTIBODY (ROUTINE TESTING W REFLEX): HIV: NONREACTIVE

## 2014-07-12 LAB — FOLLICLE STIMULATING HORMONE: FSH: 9.1 m[IU]/mL

## 2014-07-13 LAB — HUMAN PAPILLOMAVIRUS, HIGH RISK: HPV DNA HIGH RISK: NOT DETECTED

## 2014-07-14 LAB — SURESWAB, VAGINOSIS/VAGINITIS PLUS
ATOPOBIUM VAGINAE: NOT DETECTED Log (cells/mL)
C. ALBICANS, DNA: DETECTED — AB
C. GLABRATA, DNA: NOT DETECTED
C. PARAPSILOSIS, DNA: NOT DETECTED
C. TRACHOMATIS RNA, TMA: NOT DETECTED
C. TROPICALIS, DNA: NOT DETECTED
Gardnerella vaginalis: NOT DETECTED Log (cells/mL)
LACTOBACILLUS SPECIES: 7.9 Log (cells/mL)
MEGASPHAERA SPECIES: NOT DETECTED Log (cells/mL)
N. GONORRHOEAE RNA, TMA: NOT DETECTED
T. VAGINALIS RNA, QL TMA: NOT DETECTED

## 2014-07-15 ENCOUNTER — Other Ambulatory Visit: Payer: Self-pay | Admitting: Obstetrics

## 2014-07-15 DIAGNOSIS — B3731 Acute candidiasis of vulva and vagina: Secondary | ICD-10-CM

## 2014-07-15 DIAGNOSIS — B373 Candidiasis of vulva and vagina: Secondary | ICD-10-CM

## 2014-07-15 MED ORDER — FLUCONAZOLE 150 MG PO TABS: 150.0000 mg | ORAL_TABLET | Freq: Once | ORAL | Status: DC

## 2014-07-18 ENCOUNTER — Ambulatory Visit (INDEPENDENT_AMBULATORY_CARE_PROVIDER_SITE_OTHER): Payer: BLUE CROSS/BLUE SHIELD

## 2014-07-18 ENCOUNTER — Other Ambulatory Visit: Payer: BLUE CROSS/BLUE SHIELD

## 2014-07-18 ENCOUNTER — Other Ambulatory Visit: Payer: Self-pay | Admitting: *Deleted

## 2014-07-18 DIAGNOSIS — Z793 Long term (current) use of hormonal contraceptives: Principal | ICD-10-CM

## 2014-07-18 DIAGNOSIS — Z79899 Other long term (current) drug therapy: Secondary | ICD-10-CM

## 2014-07-18 DIAGNOSIS — N912 Amenorrhea, unspecified: Secondary | ICD-10-CM

## 2014-07-18 MED ORDER — LO LOESTRIN FE 1 MG-10 MCG / 10 MCG PO TABS: 1.0000 | ORAL_TABLET | Freq: Every day | ORAL | Status: DC

## 2014-07-26 ENCOUNTER — Other Ambulatory Visit: Payer: Self-pay | Admitting: *Deleted

## 2014-07-26 DIAGNOSIS — N912 Amenorrhea, unspecified: Secondary | ICD-10-CM

## 2014-07-26 DIAGNOSIS — Z793 Long term (current) use of hormonal contraceptives: Principal | ICD-10-CM

## 2014-07-26 MED ORDER — LO LOESTRIN FE 1 MG-10 MCG / 10 MCG PO TABS: 1.0000 | ORAL_TABLET | Freq: Every day | ORAL | Status: DC

## 2014-08-14 ENCOUNTER — Ambulatory Visit: Payer: BLUE CROSS/BLUE SHIELD | Admitting: Certified Nurse Midwife

## 2014-08-15 ENCOUNTER — Encounter: Payer: Self-pay | Admitting: Certified Nurse Midwife

## 2014-08-15 ENCOUNTER — Ambulatory Visit (INDEPENDENT_AMBULATORY_CARE_PROVIDER_SITE_OTHER): Payer: BLUE CROSS/BLUE SHIELD | Admitting: Certified Nurse Midwife

## 2014-08-15 VITALS — BP 104/53 | HR 86 | Temp 98.5°F | Wt 149.0 lb

## 2014-08-15 DIAGNOSIS — Z79899 Other long term (current) drug therapy: Secondary | ICD-10-CM | POA: Diagnosis not present

## 2014-08-15 DIAGNOSIS — N912 Amenorrhea, unspecified: Secondary | ICD-10-CM

## 2014-08-15 DIAGNOSIS — Z793 Long term (current) use of hormonal contraceptives: Secondary | ICD-10-CM

## 2014-08-15 DIAGNOSIS — E282 Polycystic ovarian syndrome: Secondary | ICD-10-CM

## 2014-08-15 LAB — CHOLESTEROL, TOTAL: Cholesterol: 163 mg/dL (ref 0–200)

## 2014-08-15 LAB — HDL CHOLESTEROL: HDL: 53 mg/dL (ref >=46)

## 2014-08-15 LAB — HEMOGLOBIN A1C
Hgb A1c MFr Bld: 5.7 % — ABNORMAL HIGH (ref <5.7)
MEAN PLASMA GLUCOSE: 117 mg/dL — AB (ref <117)

## 2014-08-15 LAB — TRIGLYCERIDES: Triglycerides: 46 mg/dL (ref <150)

## 2014-08-15 NOTE — Progress Notes (Signed)
Patient ID: Evelyn EdisBrilynn Russum, female   DOB: 04/08/1991, 24 y.o.   MRN: [id number]   Chief Complaint  Patient presents with  . Gynecologic Exam    follow up    HPI Evelyn Kennedy is a 24 y.o. female.  Patient reports having a menstrual cycle with OCPs, started on April  25th, 2016.  First period in over three months.   HPI  No past medical history on file.  Past Surgical History  Procedure Laterality Date  . Tonsillectomy      Family History  Problem Relation Age of Onset  . Alcohol abuse Neg Hx   . Arthritis Neg Hx   . Asthma Neg Hx   . Birth defects Neg Hx   . Cancer Neg Hx   . COPD Neg Hx   . Depression Neg Hx   . Diabetes Neg Hx   . Drug abuse Neg Hx   . Early death Neg Hx   . Hearing loss Neg Hx   . Heart disease Neg Hx   . Hyperlipidemia Neg Hx   . Hypertension Neg Hx   . Kidney disease Neg Hx   . Learning disabilities Neg Hx   . Mental illness Neg Hx   . Mental retardation Neg Hx     Social History History  Substance Use Topics  . Smoking status: Never Smoker   . Smokeless tobacco: Not on file  . Alcohol Use: No    No Known Allergies  Current Outpatient Prescriptions  Medication Sig Dispense Refill  . fluconazole (DIFLUCAN) 150 MG tablet Take 1 tablet (150 mg total) by mouth once. 1 tablet 2  . ibuprofen (ADVIL,MOTRIN) 600 MG tablet Take 1 tablet (600 mg total) by mouth every 6 (six) hours as needed for pain (pain scale < 4). 30 tablet 5  . LO LOESTRIN FE 1 MG-10 MCG / 10 MCG tablet Take 1 tablet by mouth daily. 3 Package 3   No current facility-administered medications for this visit.    Review of Systems Review of Systems Constitutional: negative for fatigue and weight loss Respiratory: negative for cough and wheezing Cardiovascular: negative for chest pain, fatigue and palpitations Gastrointestinal: negative for abdominal pain and change in bowel habits Genitourinary:negative Integument/breast: negative for nipple  discharge Musculoskeletal:negative for myalgias Neurological: negative for gait problems and tremors Behavioral/Psych: negative for abusive relationship, depression Endocrine: negative for temperature intolerance     Blood pressure 104/53, pulse 86, temperature 98.5 F (36.9 C), weight 67.586 kg (149 lb), last menstrual period 08/13/2014.  Physical Exam Physical Exam General:   alert  Skin:   no rash or abnormalities  Lungs:   clear to auscultation bilaterally  Heart:   regular rate and rhythm, S1, S2 normal, no murmur, click, rub or gallop  Breasts:   deferred  Abdomen:  deferred  Pelvis:  deferred    95% of 15 min visit spent on counseling and coordination of care.   Data Reviewed Previous medical hx, labs, US  Assessment     PCOS     Plan    Orders Placed This Encounter  Procedures  . Hemoglobin A1c  . Testosterone, Free, Total, SHBG  . 17-Hydroxyprogesterone  . Progesterone  . Cholesterol, total  . Triglycerides  . HDL cholesterol   No orders of the defined types were placed in this encounter.   Possible management options include: spironolactone or metformin Follow up as needed or in 11 months for annual exam.

## 2014-08-16 LAB — TESTOSTERONE, FREE, TOTAL, SHBG
SEX HORMONE BINDING: 70 nmol/L (ref 17–124)
TESTOSTERONE FREE: 3.6 pg/mL (ref 0.6–6.8)
Testosterone-% Free: 1.1 % (ref 0.4–2.4)
Testosterone: 33 ng/dL (ref 10–70)

## 2014-08-16 LAB — PROGESTERONE: Progesterone: 0.6 ng/mL

## 2014-08-19 LAB — 17-HYDROXYPROGESTERONE: 17-OH-PROGESTERONE, LC/MS/MS: 49 ng/dL

## 2014-08-30 ENCOUNTER — Other Ambulatory Visit: Payer: Self-pay | Admitting: Certified Nurse Midwife

## 2014-08-30 DIAGNOSIS — E282 Polycystic ovarian syndrome: Secondary | ICD-10-CM

## 2014-08-30 NOTE — Progress Notes (Signed)
Patient called and notified of test results along with dx of PCOS.  Patient educated to look to ACOG about PCOS facts for patients.  Educated to keep nutrition referral as she is prediabetic.  Patient verbalized understanding.    Orvilla Cornwallachelle Kassem Kibbe, CNM

## 2015-01-24 ENCOUNTER — Ambulatory Visit (INDEPENDENT_AMBULATORY_CARE_PROVIDER_SITE_OTHER): Payer: BLUE CROSS/BLUE SHIELD | Admitting: Obstetrics

## 2015-01-24 ENCOUNTER — Encounter: Payer: Self-pay | Admitting: Obstetrics

## 2015-01-24 VITALS — BP 123/84 | HR 77 | Temp 98.4°F | Ht 62.0 in | Wt 153.0 lb

## 2015-01-24 DIAGNOSIS — N760 Acute vaginitis: Secondary | ICD-10-CM

## 2015-01-24 DIAGNOSIS — Z0000 Encounter for general adult medical examination without abnormal findings: Secondary | ICD-10-CM

## 2015-01-24 DIAGNOSIS — Z113 Encounter for screening for infections with a predominantly sexual mode of transmission: Secondary | ICD-10-CM

## 2015-01-24 MED ORDER — PNV PRENATAL PLUS MULTIVITAMIN 27-1 MG PO TABS: 1.0000 | ORAL_TABLET | Freq: Every day | ORAL | Status: DC

## 2015-01-24 MED ORDER — FLUCONAZOLE 200 MG PO TABS: ORAL_TABLET | ORAL | Status: DC

## 2015-01-25 ENCOUNTER — Encounter: Payer: Self-pay | Admitting: Obstetrics

## 2015-01-25 LAB — HIV ANTIBODY (ROUTINE TESTING W REFLEX): HIV 1&2 Ab, 4th Generation: NONREACTIVE

## 2015-01-25 LAB — RPR

## 2015-01-25 LAB — HEPATITIS C ANTIBODY: HCV Ab: NEGATIVE

## 2015-01-25 LAB — HEPATITIS B SURFACE ANTIGEN: Hepatitis B Surface Ag: NEGATIVE

## 2015-01-25 NOTE — Progress Notes (Signed)
Patient ID: Evelyn Kennedy, female   DOB: [birth year], 24 y.o.   MRN: [id number]  Chief Complaint  Patient presents with  . Vaginitis    HPI Evelyn Kennedy is a 24 y.o. female.  Vaginal discharge and itching.   HPI  History reviewed. No pertinent past medical history.  Past Surgical History  Procedure Laterality Date  . Tonsillectomy      Family History  Problem Relation Age of Onset  . Alcohol abuse Neg Hx   . Arthritis Neg Hx   . Asthma Neg Hx   . Birth defects Neg Hx   . Cancer Neg Hx   . COPD Neg Hx   . Depression Neg Hx   . Diabetes Neg Hx   . Drug abuse Neg Hx   . Early death Neg Hx   . Hearing loss Neg Hx   . Heart disease Neg Hx   . Hyperlipidemia Neg Hx   . Hypertension Neg Hx   . Kidney disease Neg Hx   . Learning disabilities Neg Hx   . Mental illness Neg Hx   . Mental retardation Neg Hx     Social History Social History  Substance Use Topics  . Smoking status: Never Smoker   . Smokeless tobacco: Never Used  . Alcohol Use: No    No Known Allergies  Current Outpatient Prescriptions  Medication Sig Dispense Refill  . ibuprofen (ADVIL,MOTRIN) 600 MG tablet Take 1 tablet (600 mg total) by mouth every 6 (six) hours as needed for pain (pain scale < 4). 30 tablet 5  . LO LOESTRIN FE 1 MG-10 MCG / 10 MCG tablet Take 1 tablet by mouth daily. 3 Package 3  . fluconazole (DIFLUCAN) 200 MG tablet Take 1 tablet every other day 3 tablet 4  . Prenatal Vit-Fe Fumarate-FA (PNV PRENATAL PLUS MULTIVITAMIN) 27-1 MG TABS Take 1 tablet by mouth daily before breakfast. 30 tablet 11   No current facility-administered medications for this visit.    Review of Systems Review of Systems Constitutional: negative for fatigue and weight loss Respiratory: negative for cough and wheezing Cardiovascular: negative for chest pain, fatigue and palpitations Gastrointestinal: negative for abdominal pain and change in bowel habits Genitourinary: vaginal discharge Integument/breast:  negative for nipple discharge Musculoskeletal:negative for myalgias Neurological: negative for gait problems and tremors Behavioral/Psych: negative for abusive relationship, depression Endocrine: negative for temperature intolerance     Blood pressure 123/84, pulse 77, temperature 98.4 F (36.9 C), height  (1.575 m), weight 153 lb (69.4 kg).  Physical Exam Physical Exam            General:  Alert and no distress Abdomen:  normal findings: no organomegaly, soft, non-tender and no hernia  Pelvis:  External genitalia: normal general appearance Urinary system: urethral meatus normal and bladder without fullness, nontender Vaginal: normal without tenderness, induration or masses Cervix: normal appearance Adnexa: normal bimanual exam Uterus: anteverted and non-tender, normal size      Data Reviewed Labs  Assessment     Vaginitis     Plan    Fluconazole Rx F/U prn   Orders Placed This Encounter  Procedures  . SureSwab, Vaginosis/Vaginitis Plus  . HIV antibody  . Hepatitis B surface antigen  . RPR  . Hepatitis C antibody   Meds ordered this encounter  Medications  . fluconazole (DIFLUCAN) 200 MG tablet    Sig: Take 1 tablet every other day    Dispense:  3 tablet    Refill:  4  .  Prenatal Vit-Fe Fumarate-FA (PNV PRENATAL PLUS MULTIVITAMIN) 27-1 MG TABS    Sig: Take 1 tablet by mouth daily before breakfast.    Dispense:  30 tablet    Refill:  11

## 2015-01-30 LAB — SURESWAB, VAGINOSIS/VAGINITIS PLUS
ATOPOBIUM VAGINAE: 5 Log (cells/mL)
BV CATEGORY: UNDETERMINED — AB
C. albicans, DNA: DETECTED — AB
C. glabrata, DNA: NOT DETECTED
C. parapsilosis, DNA: NOT DETECTED
C. trachomatis RNA, TMA: NOT DETECTED
C. tropicalis, DNA: NOT DETECTED
GARDNERELLA VAGINALIS: 7.8 Log (cells/mL)
LACTOBACILLUS SPECIES: 6.8 Log (cells/mL)
MEGASPHAERA SPECIES: NOT DETECTED Log (cells/mL)
N. gonorrhoeae RNA, TMA: NOT DETECTED
T. VAGINALIS RNA, QL TMA: NOT DETECTED

## 2015-02-01 ENCOUNTER — Other Ambulatory Visit: Payer: Self-pay | Admitting: Obstetrics

## 2015-02-01 DIAGNOSIS — B9689 Other specified bacterial agents as the cause of diseases classified elsewhere: Secondary | ICD-10-CM

## 2015-02-01 DIAGNOSIS — N760 Acute vaginitis: Principal | ICD-10-CM

## 2015-02-01 MED ORDER — METRONIDAZOLE 500 MG PO TABS: 500.0000 mg | ORAL_TABLET | Freq: Two times a day (BID) | ORAL | Status: DC

## 2015-07-18 ENCOUNTER — Other Ambulatory Visit: Payer: Self-pay | Admitting: Obstetrics

## 2016-03-11 ENCOUNTER — Encounter: Payer: Self-pay | Admitting: *Deleted

## 2016-03-11 ENCOUNTER — Encounter: Payer: Self-pay | Admitting: Certified Nurse Midwife

## 2016-03-11 ENCOUNTER — Ambulatory Visit (INDEPENDENT_AMBULATORY_CARE_PROVIDER_SITE_OTHER): Payer: BLUE CROSS/BLUE SHIELD | Admitting: Certified Nurse Midwife

## 2016-03-11 VITALS — BP 115/71 | HR 70 | Wt 161.4 lb

## 2016-03-11 DIAGNOSIS — E282 Polycystic ovarian syndrome: Secondary | ICD-10-CM | POA: Diagnosis not present

## 2016-03-11 DIAGNOSIS — Z124 Encounter for screening for malignant neoplasm of cervix: Secondary | ICD-10-CM

## 2016-03-11 DIAGNOSIS — Z1151 Encounter for screening for human papillomavirus (HPV): Secondary | ICD-10-CM

## 2016-03-11 DIAGNOSIS — B373 Candidiasis of vulva and vagina: Secondary | ICD-10-CM

## 2016-03-11 DIAGNOSIS — Z01419 Encounter for gynecological examination (general) (routine) without abnormal findings: Secondary | ICD-10-CM | POA: Diagnosis not present

## 2016-03-11 DIAGNOSIS — B3731 Acute candidiasis of vulva and vagina: Secondary | ICD-10-CM

## 2016-03-11 DIAGNOSIS — Z3041 Encounter for surveillance of contraceptive pills: Secondary | ICD-10-CM | POA: Diagnosis not present

## 2016-03-11 DIAGNOSIS — Z0000 Encounter for general adult medical examination without abnormal findings: Secondary | ICD-10-CM

## 2016-03-11 MED ORDER — FLUCONAZOLE 200 MG PO TABS: 200.0000 mg | ORAL_TABLET | Freq: Once | ORAL | 0 refills | Status: AC

## 2016-03-11 MED ORDER — NORETHIN-ETH ESTRAD-FE BIPHAS 1 MG-10 MCG / 10 MCG PO TABS: 1.0000 | ORAL_TABLET | Freq: Every day | ORAL | 3 refills | Status: DC

## 2016-03-11 NOTE — Progress Notes (Signed)
Subjective:      Evelyn Kennedy is a 25 y.o. female here for a routine exam.  Current complaints: none.  Reports regular periods with LoLo, light periods with brown spotting.  In monogamous relationship with boyfriend.   Declines blood STD testing.  Reports normal discharge.   Lost her mother two years ago.  Not currently exercising.    Personal health questionnaire:  Is patient Ashkenazi Jewish, have a family history of breast and/or ovarian cancer: no Is there a family history of uterine cancer diagnosed at age < 3050, gastrointestinal cancer, urinary tract cancer, family member who is a Personnel officerLynch syndrome-associated carrier: no Is the patient overweight and hypertensive, family history of diabetes, personal history of gestational diabetes, preeclampsia or PCOS: yes Is patient over 7455, have PCOS,  family history of premature CHD under age 10565, diabetes, smoke, have hypertension or peripheral artery disease:  Yes; maternal side HTN, CVA, DM At any time, has a partner hit, kicked or otherwise hurt or frightened you?: no Over the past 2 weeks, have you felt down, depressed or hopeless?: no Over the past 2 weeks, have you felt little interest or pleasure in doing things?:no   Gynecologic History Patient's last menstrual period was 02/24/2016 (approximate). Contraception: OCP (estrogen/progesterone) Last Pap: 07/11/14. Results were: ASCUS, HPV negative Last mammogram: n/a.   Obstetric History OB History  Gravida Para Term Preterm AB Living  1 1 1     1   SAB TAB Ectopic Multiple Live Births          1    # Outcome Date GA Lbr Len/2nd Weight Sex Delivery Anes PTL Lv  1 Term 05/15/12 1446w2d -11:49 / 00:40 7 lb 0.3 oz (3.184 kg) F Vag-Spont EPI  LIV      No past medical history on file.  Past Surgical History:  Procedure Laterality Date  . TONSILLECTOMY       Current Outpatient Prescriptions:  .  fluconazole (DIFLUCAN) 200 MG tablet, Take 1 tablet every other day, Disp: 3 tablet, Rfl:  4 .  ibuprofen (ADVIL,MOTRIN) 600 MG tablet, Take 1 tablet (600 mg total) by mouth every 6 (six) hours as needed for pain (pain scale < 4)., Disp: 30 tablet, Rfl: 5 .  LO LOESTRIN FE 1 MG-10 MCG / 10 MCG tablet, TAKE 1 TABLET BY MOUTH DAILY., Disp: 84 tablet, Rfl: 3 .  metroNIDAZOLE (FLAGYL) 500 MG tablet, Take 1 tablet (500 mg total) by mouth 2 (two) times daily., Disp: 14 tablet, Rfl: 2 .  Prenatal Vit-Fe Fumarate-FA (PNV PRENATAL PLUS MULTIVITAMIN) 27-1 MG TABS, Take 1 tablet by mouth daily before breakfast., Disp: 30 tablet, Rfl: 11 No Known Allergies  Social History  Substance Use Topics  . Smoking status: Never Smoker  . Smokeless tobacco: Never Used  . Alcohol use No    Family History  Problem Relation Age of Onset  . Alcohol abuse Neg Hx   . Arthritis Neg Hx   . Asthma Neg Hx   . Birth defects Neg Hx   . Cancer Neg Hx   . COPD Neg Hx   . Depression Neg Hx   . Diabetes Neg Hx   . Drug abuse Neg Hx   . Early death Neg Hx   . Hearing loss Neg Hx   . Heart disease Neg Hx   . Hyperlipidemia Neg Hx   . Hypertension Neg Hx   . Kidney disease Neg Hx   . Learning disabilities Neg Hx   . Mental  illness Neg Hx   . Mental retardation Neg Hx       Review of Systems  Constitutional: negative for fatigue and weight loss Respiratory: negative for cough and wheezing Cardiovascular: negative for chest pain, fatigue and palpitations Gastrointestinal: negative for abdominal pain and change in bowel habits Musculoskeletal:negative for myalgias Neurological: negative for gait problems and tremors Behavioral/Psych: negative for abusive relationship, depression Endocrine: negative for temperature intolerance    Genitourinary:negative for abnormal menstrual periods, genital lesions, hot flashes, sexual problems and vaginal discharge Integument/breast: negative for breast lump, breast tenderness, nipple discharge and skin lesion(s)    Objective:       BP 115/71   Pulse 70   Wt 161  lb 6.4 oz (73.2 kg)   LMP 02/24/2016 (Approximate)   BMI 29.52 kg/m  General:   alert  Skin:   no rash or abnormalities  Lungs:   clear to auscultation bilaterally  Heart:   regular rate and rhythm, S1, S2 normal, no murmur, click, rub or gallop  Breasts:   normal without suspicious masses, skin or nipple changes or axillary nodes  Abdomen:  normal findings: no organomegaly, soft, non-tender and no hernia  Pelvis:  External genitalia: normal general appearance Urinary system: urethral meatus normal and bladder without fullness, nontender Vaginal: normal without tenderness, induration or masses Cervix: normal appearance Adnexa: normal bimanual exam Uterus: anteverted and non-tender, normal size   Lab Review Urine pregnancy test Labs reviewed yes Radiologic studies reviewed no  50% of 30 min visit spent on counseling and coordination of care.    Assessment:    Healthy female exam.   H/O PCOS  Pre diabetes  Contraception management  Plan:    Education reviewed: calcium supplements, depression evaluation, low fat, low cholesterol diet, safe sex/STD prevention, self breast exams, skin cancer screening and weight bearing exercise. Contraception: OCP (estrogen/progesterone). Follow up in: 1 year.   No orders of the defined types were placed in this encounter.  Orders Placed This Encounter  Procedures  . NuSwab Vaginitis (VG)   Need to obtain previous records Possible management options include: LARK Follow up as needed.

## 2016-03-12 LAB — HEMOGLOBIN A1C
ESTIMATED AVERAGE GLUCOSE: 114 mg/dL
HEMOGLOBIN A1C: 5.6 % (ref 4.8–5.6)

## 2016-03-15 LAB — NUSWAB VAGINITIS (VG)
CANDIDA ALBICANS, NAA: NEGATIVE
Candida glabrata, NAA: NEGATIVE
Trich vag by NAA: NEGATIVE

## 2016-03-16 MED ORDER — NORETHIN-ETH ESTRAD-FE BIPHAS 1 MG-10 MCG / 10 MCG PO TABS: 1.0000 | ORAL_TABLET | Freq: Every day | ORAL | 3 refills | Status: DC

## 2016-03-17 LAB — CYTOLOGY - PAP
Adequacy: ABSENT
Diagnosis: NEGATIVE

## 2016-06-18 ENCOUNTER — Other Ambulatory Visit: Payer: Self-pay | Admitting: Certified Nurse Midwife

## 2016-10-28 ENCOUNTER — Encounter: Payer: Self-pay | Admitting: Obstetrics

## 2016-10-28 ENCOUNTER — Ambulatory Visit (INDEPENDENT_AMBULATORY_CARE_PROVIDER_SITE_OTHER): Payer: PRIVATE HEALTH INSURANCE

## 2016-10-28 DIAGNOSIS — Z3201 Encounter for pregnancy test, result positive: Secondary | ICD-10-CM

## 2016-10-28 DIAGNOSIS — N912 Amenorrhea, unspecified: Secondary | ICD-10-CM

## 2016-10-28 LAB — POCT URINE PREGNANCY: Preg Test, Ur: POSITIVE — AB

## 2016-10-28 NOTE — Progress Notes (Signed)
Pt presents for pregnancy test. Pregnancy test today is positive. Pt is unsure of last cycle. States that she was on Lo LoEstrin and stopped taking this at the end of May. Pt did not have a withdrawal bleed or cycle since then. She also states that her cycles on the pill were very irregular. Pt advised to schedule New OB appt for around 10 weeks. Pt is about 2661w3d today by last cycle while taking OCP

## 2016-12-02 ENCOUNTER — Encounter: Payer: Self-pay | Admitting: Certified Nurse Midwife

## 2016-12-02 ENCOUNTER — Ambulatory Visit (INDEPENDENT_AMBULATORY_CARE_PROVIDER_SITE_OTHER): Payer: Medicaid Other | Admitting: Certified Nurse Midwife

## 2016-12-02 VITALS — BP 132/72 | HR 91 | Wt 148.2 lb

## 2016-12-02 DIAGNOSIS — Z3481 Encounter for supervision of other normal pregnancy, first trimester: Secondary | ICD-10-CM | POA: Diagnosis not present

## 2016-12-02 DIAGNOSIS — Z3491 Encounter for supervision of normal pregnancy, unspecified, first trimester: Secondary | ICD-10-CM

## 2016-12-02 DIAGNOSIS — Z3490 Encounter for supervision of normal pregnancy, unspecified, unspecified trimester: Secondary | ICD-10-CM | POA: Insufficient documentation

## 2016-12-02 NOTE — Progress Notes (Signed)
Subjective:    Evelyn Kennedy is being seen today for her first obstetrical visit.  This is not a planned pregnancy. She is at 2072w3d gestation. Her obstetrical history is significant for PCOS. Relationship with FOB: significant other, living together. Patient does intend to breast feed. Pregnancy history fully reviewed.  The information documented in the HPI was reviewed and verified.  Menstrual History: OB History    Gravida Para Term Preterm AB Living   3 1 1   1 1    SAB TAB Ectopic Multiple Live Births     1     1       Patient's last menstrual period was 09/13/2016.    History reviewed. No pertinent past medical history.  Past Surgical History:  Procedure Laterality Date  . TONSILLECTOMY       (Not in a hospital admission) No Known Allergies  Social History  Substance Use Topics  . Smoking status: Never Smoker  . Smokeless tobacco: Never Used  . Alcohol use No    Family History  Problem Relation Age of Onset  . Hypertension Father   . Diabetes Maternal Grandfather   . Stroke Maternal Grandfather   . Heart attack Paternal Grandfather   . Alcohol abuse Neg Hx   . Arthritis Neg Hx   . Asthma Neg Hx   . Birth defects Neg Hx   . Cancer Neg Hx   . COPD Neg Hx   . Depression Neg Hx   . Drug abuse Neg Hx   . Early death Neg Hx   . Hearing loss Neg Hx   . Heart disease Neg Hx   . Hyperlipidemia Neg Hx   . Kidney disease Neg Hx   . Learning disabilities Neg Hx   . Mental illness Neg Hx   . Mental retardation Neg Hx      Review of Systems Constitutional: negative for weight loss Gastrointestinal: negative for vomiting Genitourinary:negative for genital lesions and vaginal discharge and dysuria Musculoskeletal:negative for back pain Behavioral/Psych: negative for abusive relationship, depression, illegal drug usage and tobacco use    Objective:    BP 132/72   Pulse 91   Wt 148 lb 3.2 oz (67.2 kg)   LMP 09/13/2016   BMI 27.11 kg/m  General Appearance:     Alert, cooperative, no distress, appears stated age  Head:    Normocephalic, without obvious abnormality, atraumatic  Eyes:    PERRL, conjunctiva/corneas clear, EOM's intact, fundi    benign, both eyes  Ears:    Normal TM's and external ear canals, both ears  Nose:   Nares normal, septum midline, mucosa normal, no drainage    or sinus tenderness  Throat:   Lips, mucosa, and tongue normal; teeth and gums normal  Neck:   Supple, symmetrical, trachea midline, no adenopathy;    thyroid:  no enlargement/tenderness/nodules; no carotid   bruit or JVD  Back:     Symmetric, no curvature, ROM normal, no CVA tenderness  Lungs:     Clear to auscultation bilaterally, respirations unlabored  Chest Wall:    No tenderness or deformity   Heart:    Regular rate and rhythm, S1 and S2 normal, no murmur, rub   or gallop  Breast Exam:    No tenderness, masses, or nipple abnormality  Abdomen:     Soft, non-tender, bowel sounds active all four quadrants,    no masses, no organomegaly  Genitalia:    Normal female without lesion, discharge or tenderness  Extremities:  Extremities normal, atraumatic, no cyanosis or edema  Pulses:   2+ and symmetric all extremities  Skin:   Skin color, texture, turgor normal, no rashes or lesions  Lymph nodes:   Cervical, supraclavicular, and axillary nodes normal  Neurologic:   CNII-XII intact, normal strength, sensation and reflexes    throughout          Cervix: long, thick, closed and posterior.  Size c/w dates.  FHR: 150's; doppler    Lab Review Urine pregnancy test Labs reviewed yes Radiologic studies reviewed no  Assessment & Plan    Pregnancy at [redacted]w[redacted]d weeks    1. Encounter for supervision of normal pregnancy, antepartum, unspecified gravidity      Candidate for baby scripts.  - Hemoglobinopathy evaluation - Varicella zoster antibody, IgG - Culture, OB Urine - MaterniT21 PLUS Core+SCA - Hemoglobin A1c - Obstetric Panel, Including HIV - Cystic Fibrosis Mutation  97     Prenatal vitamins.  Counseling provided regarding continued use of seat belts, cessation of alcohol consumption, smoking or use of illicit drugs; infection precautions i.e., influenza/TDAP immunizations, toxoplasmosis,CMV, parvovirus, listeria and varicella; workplace safety, exercise during pregnancy; routine dental care, safe medications, sexual activity, hot tubs, saunas, pools, travel, caffeine use, fish and methlymercury, potential toxins, hair treatments, varicose veins Weight gain recommendations per IOM guidelines reviewed: underweight/BMI< 18.5--> gain 28 - 40 lbs; normal weight/BMI 18.5 - 24.9--> gain 25 - 35 lbs; overweight/BMI 25 - 29.9--> gain 15 - 25 lbs; obese/BMI >30->gain  11 - 20 lbs Problem list reviewed and updated. FIRST/CF mutation testing/NIPT/QUAD SCREEN/fragile X/Ashkenazi Jewish population testing/Spinal muscular atrophy discussed: ordered. Role of ultrasound in pregnancy discussed; fetal survey: ordered. Amniocentesis discussed: not indicated.  No orders of the defined types were placed in this encounter.  Orders Placed This Encounter  Procedures  . Culture, OB Urine  . Hemoglobinopathy evaluation  . Varicella zoster antibody, IgG  . MaterniT21 PLUS Core+SCA    Order Specific Question:   Is the patient insulin dependent?    Answer:   No    Order Specific Question:   Please enter gestational age. This should be expressed as weeks AND days, i.e. 16w 6d. Enter weeks here. Enter days in next question.    Answer:   12    Order Specific Question:   Please enter gestational age. This should be expressed as weeks AND days, i.e. 16w 6d. Enter days here. Enter weeks in previous question.    Answer:   3    Order Specific Question:   How was gestational age calculated?    Answer:   LMP    Order Specific Question:   Please give the date of LMP OR Ultrasound OR Estimated date of delivery.    Answer:   06/20/2017    Order Specific Question:   Number of Fetuses (Type of  Pregnancy):    Answer:   1    Order Specific Question:   Indications for performing the test? (please choose all that apply):    Answer:   Routine screening    Order Specific Question:   Other Indications? (Y=Yes, N=No)    Answer:   N    Order Specific Question:   If this is a repeat specimen, please indicate the reason:    Answer:   Not indicated    Order Specific Question:   Please specify the patient's race: (C=White/Caucasion, B=Black, I=Native American, A=Asian, H=Hispanic, O=Other, U=Unknown)    Answer:   B    Order Specific Question:  Donor Egg - indicate if the egg was obtained from in vitro fertilization.    Answer:   N    Order Specific Question:   Age of Egg Donor.    Answer:   55    Order Specific Question:   Prior Down Syndrome/ONTD screening during current pregnancy.    Answer:   N    Order Specific Question:   Prior First Trimester Testing    Answer:   N    Order Specific Question:   Prior Second Trimester Testing    Answer:   N    Order Specific Question:   Family History of Neural Tube Defects    Answer:   N    Order Specific Question:   Prior Pregnancy with Down Syndrome    Answer:   N    Order Specific Question:   Please give the patient's weight (in pounds)    Answer:   149  . Hemoglobin A1c  . Obstetric Panel, Including HIV  . Cystic Fibrosis Mutation 97    Follow up in 4 weeks. 50% of 30 min visit spent on counseling and coordination of care.

## 2016-12-03 ENCOUNTER — Other Ambulatory Visit (HOSPITAL_COMMUNITY)
Admission: RE | Admit: 2016-12-03 | Discharge: 2016-12-03 | Disposition: A | Discharge status: Home / Self Care | Payer: Medicaid Other | Source: Ambulatory Visit | Attending: Certified Nurse Midwife | Admitting: Certified Nurse Midwife

## 2016-12-03 DIAGNOSIS — Z3A00 Weeks of gestation of pregnancy not specified: Secondary | ICD-10-CM | POA: Insufficient documentation

## 2016-12-03 DIAGNOSIS — B9689 Other specified bacterial agents as the cause of diseases classified elsewhere: Secondary | ICD-10-CM | POA: Diagnosis not present

## 2016-12-03 DIAGNOSIS — N760 Acute vaginitis: Secondary | ICD-10-CM | POA: Diagnosis not present

## 2016-12-03 DIAGNOSIS — O98819 Other maternal infectious and parasitic diseases complicating pregnancy, unspecified trimester: Secondary | ICD-10-CM | POA: Insufficient documentation

## 2016-12-03 DIAGNOSIS — Z3490 Encounter for supervision of normal pregnancy, unspecified, unspecified trimester: Secondary | ICD-10-CM | POA: Diagnosis present

## 2016-12-03 NOTE — Addendum Note (Signed)
Addended by: Hamilton CapriBURCH, Kennith Morss J on: 12/03/2016 11:25 AM   Modules accepted: Orders

## 2016-12-04 LAB — URINE CULTURE, OB REFLEX

## 2016-12-04 LAB — CERVICOVAGINAL ANCILLARY ONLY
BACTERIAL VAGINITIS: POSITIVE — AB
CANDIDA VAGINITIS: NEGATIVE
Chlamydia: NEGATIVE
Neisseria Gonorrhea: NEGATIVE
Trichomonas: NEGATIVE

## 2016-12-04 LAB — CULTURE, OB URINE

## 2016-12-05 LAB — OBSTETRIC PANEL, INCLUDING HIV
Antibody Screen: NEGATIVE
BASOS ABS: 0 10*3/uL (ref 0.0–0.2)
Basos: 0 %
EOS (ABSOLUTE): 0.1 10*3/uL (ref 0.0–0.4)
Eos: 1 %
HEP B S AG: NEGATIVE
HIV SCREEN 4TH GENERATION: NONREACTIVE
Hematocrit: 39.5 % (ref 34.0–46.6)
Hemoglobin: 12.8 g/dL (ref 11.1–15.9)
IMMATURE GRANULOCYTES: 1 %
Immature Grans (Abs): 0.1 10*3/uL (ref 0.0–0.1)
Lymphocytes Absolute: 4.3 10*3/uL — ABNORMAL HIGH (ref 0.7–3.1)
Lymphs: 44 %
MCH: 29.4 pg (ref 26.6–33.0)
MCHC: 32.4 g/dL (ref 31.5–35.7)
MCV: 91 fL (ref 79–97)
MONOCYTES: 9 %
Monocytes Absolute: 0.9 10*3/uL (ref 0.1–0.9)
NEUTROS ABS: 4.4 10*3/uL (ref 1.4–7.0)
NEUTROS PCT: 45 %
PLATELETS: 299 10*3/uL (ref 150–379)
RBC: 4.35 x10E6/uL (ref 3.77–5.28)
RDW: 14 % (ref 12.3–15.4)
RPR Ser Ql: NONREACTIVE
Rh Factor: POSITIVE
WBC: 9.6 10*3/uL (ref 3.4–10.8)

## 2016-12-05 LAB — HEMOGLOBINOPATHY EVALUATION
HGB A: 97.5 % (ref 96.4–98.8)
HGB C: 0 %
HGB S: 0 %
HGB VARIANT: 0 %
Hemoglobin A2 Quantitation: 2.5 % (ref 1.8–3.2)
Hemoglobin F Quantitation: 0 % (ref 0.0–2.0)

## 2016-12-05 LAB — HEMOGLOBIN A1C
ESTIMATED AVERAGE GLUCOSE: 111 mg/dL
HEMOGLOBIN A1C: 5.5 % (ref 4.8–5.6)

## 2016-12-05 LAB — VARICELLA ZOSTER ANTIBODY, IGG: Varicella zoster IgG: 509 index (ref >165)

## 2016-12-07 ENCOUNTER — Other Ambulatory Visit: Payer: Self-pay | Admitting: Certified Nurse Midwife

## 2016-12-07 DIAGNOSIS — Z283 Underimmunization status: Secondary | ICD-10-CM | POA: Insufficient documentation

## 2016-12-07 DIAGNOSIS — Z3480 Encounter for supervision of other normal pregnancy, unspecified trimester: Secondary | ICD-10-CM

## 2016-12-07 DIAGNOSIS — O09899 Supervision of other high risk pregnancies, unspecified trimester: Secondary | ICD-10-CM | POA: Insufficient documentation

## 2016-12-07 DIAGNOSIS — N760 Acute vaginitis: Principal | ICD-10-CM

## 2016-12-07 DIAGNOSIS — Z2839 Other underimmunization status: Secondary | ICD-10-CM | POA: Insufficient documentation

## 2016-12-07 DIAGNOSIS — B9689 Other specified bacterial agents as the cause of diseases classified elsewhere: Secondary | ICD-10-CM

## 2016-12-07 DIAGNOSIS — O9989 Other specified diseases and conditions complicating pregnancy, childbirth and the puerperium: Secondary | ICD-10-CM

## 2016-12-07 MED ORDER — METRONIDAZOLE 0.75 % VA GEL: 1.0000 | Freq: Two times a day (BID) | VAGINAL | 0 refills | Status: DC

## 2016-12-08 ENCOUNTER — Other Ambulatory Visit: Payer: Self-pay | Admitting: Certified Nurse Midwife

## 2016-12-08 ENCOUNTER — Telehealth: Payer: Self-pay

## 2016-12-08 DIAGNOSIS — Z3480 Encounter for supervision of other normal pregnancy, unspecified trimester: Secondary | ICD-10-CM

## 2016-12-08 LAB — MATERNIT21 PLUS CORE+SCA
CHROMOSOME 13: NEGATIVE
CHROMOSOME 21: NEGATIVE
Chromosome 18: NEGATIVE
Y CHROMOSOME: NOT DETECTED

## 2016-12-08 LAB — CYSTIC FIBROSIS MUTATION 97: Interpretation: NOT DETECTED

## 2016-12-08 NOTE — Telephone Encounter (Signed)
Patient notified of results and RX. Also need for MMR postpartum.

## 2016-12-08 NOTE — Progress Notes (Signed)
Left message on VM to call office.

## 2016-12-08 NOTE — Telephone Encounter (Signed)
-----  Message from Morene Crocker, CNM sent at 12/07/2016  4:51 PM EDT ----- Please call and let her know that her labs look good.  She does have BV.  Metrogel was sent to the pharmacy for her to use.   Please let her know that she is not immune to rubella and needs an MMR postpartum.    Thank you.  R.Denney CNM

## 2016-12-30 ENCOUNTER — Encounter: Payer: Medicaid Other | Admitting: Certified Nurse Midwife

## 2016-12-31 ENCOUNTER — Ambulatory Visit (INDEPENDENT_AMBULATORY_CARE_PROVIDER_SITE_OTHER): Payer: Medicaid Other | Admitting: Certified Nurse Midwife

## 2016-12-31 ENCOUNTER — Encounter: Payer: Self-pay | Admitting: Certified Nurse Midwife

## 2016-12-31 VITALS — BP 120/68 | HR 93 | Wt 157.0 lb

## 2016-12-31 DIAGNOSIS — Z283 Underimmunization status: Secondary | ICD-10-CM

## 2016-12-31 DIAGNOSIS — Z2839 Other underimmunization status: Secondary | ICD-10-CM

## 2016-12-31 DIAGNOSIS — O09899 Supervision of other high risk pregnancies, unspecified trimester: Secondary | ICD-10-CM

## 2016-12-31 DIAGNOSIS — Z3480 Encounter for supervision of other normal pregnancy, unspecified trimester: Secondary | ICD-10-CM

## 2016-12-31 DIAGNOSIS — O9989 Other specified diseases and conditions complicating pregnancy, childbirth and the puerperium: Secondary | ICD-10-CM

## 2016-12-31 DIAGNOSIS — Z3482 Encounter for supervision of other normal pregnancy, second trimester: Secondary | ICD-10-CM

## 2016-12-31 MED ORDER — PRENATE PIXIE 10-0.6-0.4-200 MG PO CAPS: 1.0000 | ORAL_CAPSULE | Freq: Every day | ORAL | 12 refills | Status: DC

## 2016-12-31 NOTE — Progress Notes (Signed)
Patient reports that she has started to feel baby move a little, reports headaches.

## 2016-12-31 NOTE — Progress Notes (Signed)
   PRENATAL VISIT NOTE  Subjective:  Evelyn Kennedy is a 26 y.o. G3P1011 at 30w4dbeing seen today for ongoing prenatal care.  She is currently monitored for the following issues for this low-risk pregnancy and has PCOS (polycystic ovarian syndrome); Encounter for supervision of normal pregnancy, unspecified, unspecified trimester; and Rubella non-immune status, antepartum on her problem list.  Patient reports no complaints.  Contractions: Not present. Vag. Bleeding: None.  Movement: Present. Denies leaking of fluid.   The following portions of the patient's history were reviewed and updated as appropriate: allergies, current medications, past family history, past medical history, past social history, past surgical history and problem list. Problem list updated.  Objective:   Vitals:   12/31/16 1508  BP: 120/68  Pulse: 93  Weight: 157 lb (71.2 kg)    Fetal Status: Fetal Heart Rate (bpm): 156; doppler   Movement: Present     General:  Alert, oriented and cooperative. Patient is in no acute distress.  Skin: Skin is warm and dry. No rash noted.   Cardiovascular: Normal heart rate noted  Respiratory: Normal respiratory effort, no problems with respiration noted  Abdomen: Soft, gravid, appropriate for gestational age.  Pain/Pressure: Absent     Pelvic: Cervical exam deferred        Extremities: Normal range of motion.  Edema: None  Mental Status:  Normal mood and affect. Normal behavior. Normal judgment and thought content.   Assessment and Plan:  Pregnancy: G3P1011 at 167w4d1. Supervision of other normal pregnancy, antepartum     Doing well.  - Prenat-FeAsp-Meth-FA-DHA w/o A (PRENATE PIXIE) 10-0.6-0.4-200 MG CAPS; Take 1 tablet by mouth daily.  Dispense: 30 capsule; Refill: 12 - AFP, Serum, Open Spina Bifida  2. Rubella non-immune status, antepartum      MMR postpartum.   Preterm labor symptoms and general obstetric precautions including but not limited to vaginal bleeding,  contractions, leaking of fluid and fetal movement were reviewed in detail with the patient. Please refer to After Visit Summary for other counseling recommendations.  Return in about 4 weeks (around 01/28/2017) for ROB.   RaMorene CrockerCNM

## 2017-01-06 ENCOUNTER — Other Ambulatory Visit: Payer: Self-pay | Admitting: Certified Nurse Midwife

## 2017-01-06 DIAGNOSIS — Z3480 Encounter for supervision of other normal pregnancy, unspecified trimester: Secondary | ICD-10-CM

## 2017-01-06 LAB — AFP, SERUM, OPEN SPINA BIFIDA
AFP MoM: 1.06
AFP Value: 31.4 ng/mL
Gest. Age on Collection Date: 15.6 weeks
Maternal Age At EDD: 27 yr
OSBR Risk 1 IN: 10000
TEST RESULTS AFP: NEGATIVE
WEIGHT: 187 [lb_av]

## 2017-01-25 ENCOUNTER — Telehealth: Payer: Self-pay

## 2017-01-25 ENCOUNTER — Encounter (HOSPITAL_COMMUNITY): Payer: Self-pay | Admitting: Certified Nurse Midwife

## 2017-01-25 DIAGNOSIS — B379 Candidiasis, unspecified: Secondary | ICD-10-CM

## 2017-01-25 MED ORDER — TERCONAZOLE 0.8 % VA CREA: 1.0000 | TOPICAL_CREAM | Freq: Every day | VAGINAL | 0 refills | Status: DC

## 2017-01-25 NOTE — Telephone Encounter (Signed)
Pt called requesting rx for yeast infection. Per protocol rx for terconazole sent to pharmacy

## 2017-01-28 ENCOUNTER — Ambulatory Visit (INDEPENDENT_AMBULATORY_CARE_PROVIDER_SITE_OTHER): Payer: Medicaid Other | Admitting: Obstetrics and Gynecology

## 2017-01-28 VITALS — BP 131/74 | HR 111 | Wt 164.0 lb

## 2017-01-28 DIAGNOSIS — Z2839 Other underimmunization status: Secondary | ICD-10-CM

## 2017-01-28 DIAGNOSIS — Z283 Underimmunization status: Secondary | ICD-10-CM

## 2017-01-28 DIAGNOSIS — Z3480 Encounter for supervision of other normal pregnancy, unspecified trimester: Secondary | ICD-10-CM

## 2017-01-28 DIAGNOSIS — O9989 Other specified diseases and conditions complicating pregnancy, childbirth and the puerperium: Secondary | ICD-10-CM

## 2017-01-28 NOTE — Progress Notes (Signed)
Pt requests to think about flu vaccine before receiving it. Flu vaccine ACOG info given.

## 2017-01-28 NOTE — Progress Notes (Signed)
Subjective:  Evelyn Kennedy is a 26 y.o. G3P1011 at [redacted]w[redacted]d being seen today for ongoing prenatal care.  She is currently monitored for the following issues for this low-risk pregnancy and has PCOS (polycystic ovarian syndrome); Encounter for supervision of normal pregnancy, unspecified, unspecified trimester; and Rubella non-immune status, antepartum on her problem list.  Patient reports no complaints.  Contractions: Not present. Vag. Bleeding: None.  Movement: Present. Denies leaking of fluid.   The following portions of the patient's history were reviewed and updated as appropriate: allergies, current medications, past family history, past medical history, past social history, past surgical history and problem list. Problem list updated.  Objective:   Vitals:   01/28/17 0924  BP: 131/74  Pulse: (!) 111  Weight: 164 lb (74.4 kg)    Fetal Status: Fetal Heart Rate (bpm): 154   Movement: Present     General:  Alert, oriented and cooperative. Patient is in no acute distress.  Skin: Skin is warm and dry. No rash noted.   Cardiovascular: Normal heart rate noted  Respiratory: Normal respiratory effort, no problems with respiration noted  Abdomen: Soft, gravid, appropriate for gestational age. Pain/Pressure: Absent     Pelvic:  Cervical exam deferred        Extremities: Normal range of motion.  Edema: None  Mental Status: Normal mood and affect. Normal behavior. Normal judgment and thought content.   Urinalysis:      Assessment and Plan:  Pregnancy: G3P1011 at [redacted]w[redacted]d  1. Supervision of other normal pregnancy, antepartum Stable   2. Rubella non-immune status, antepartum Vaccine PP  Preterm labor symptoms and general obstetric precautions including but not limited to vaginal bleeding, contractions, leaking of fluid and fetal movement were reviewed in detail with the patient. Please refer to After Visit Summary for other counseling recommendations.  Return in about 4 weeks (around  02/25/2017) for OB visit.   Hermina Staggers, MD

## 2017-02-02 ENCOUNTER — Other Ambulatory Visit: Payer: Self-pay | Admitting: Certified Nurse Midwife

## 2017-02-02 ENCOUNTER — Ambulatory Visit (HOSPITAL_COMMUNITY)
Admission: RE | Admit: 2017-02-02 | Discharge: 2017-02-02 | Disposition: A | Discharge status: Home / Self Care | Payer: Medicaid Other | Source: Ambulatory Visit | Attending: Certified Nurse Midwife | Admitting: Certified Nurse Midwife

## 2017-02-02 DIAGNOSIS — Z3689 Encounter for other specified antenatal screening: Secondary | ICD-10-CM

## 2017-02-02 DIAGNOSIS — Z3492 Encounter for supervision of normal pregnancy, unspecified, second trimester: Secondary | ICD-10-CM

## 2017-02-02 DIAGNOSIS — Z3490 Encounter for supervision of normal pregnancy, unspecified, unspecified trimester: Secondary | ICD-10-CM

## 2017-02-02 DIAGNOSIS — Z3A20 20 weeks gestation of pregnancy: Secondary | ICD-10-CM

## 2017-02-02 DIAGNOSIS — Z3687 Encounter for antenatal screening for uncertain dates: Secondary | ICD-10-CM | POA: Diagnosis not present

## 2017-02-03 ENCOUNTER — Other Ambulatory Visit: Payer: Self-pay | Admitting: Certified Nurse Midwife

## 2017-02-03 DIAGNOSIS — Z3480 Encounter for supervision of other normal pregnancy, unspecified trimester: Secondary | ICD-10-CM

## 2017-02-04 ENCOUNTER — Other Ambulatory Visit: Payer: Self-pay | Admitting: Certified Nurse Midwife

## 2017-02-06 ENCOUNTER — Encounter (HOSPITAL_COMMUNITY): Payer: Self-pay | Admitting: Emergency Medicine

## 2017-02-06 ENCOUNTER — Ambulatory Visit (HOSPITAL_COMMUNITY)
Admission: EM | Admit: 2017-02-06 | Discharge: 2017-02-06 | Disposition: A | Discharge status: Home / Self Care | Payer: PRIVATE HEALTH INSURANCE | Attending: Radiology | Admitting: Radiology

## 2017-02-06 DIAGNOSIS — H9203 Otalgia, bilateral: Secondary | ICD-10-CM | POA: Diagnosis not present

## 2017-02-06 MED ORDER — AMOXICILLIN 500 MG PO TABS: 500.0000 mg | ORAL_TABLET | Freq: Two times a day (BID) | ORAL | 0 refills | Status: AC

## 2017-02-06 NOTE — Discharge Instructions (Signed)
Take allergy medication as directed by obgyn
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
## 2017-02-15 ENCOUNTER — Other Ambulatory Visit: Payer: Self-pay | Admitting: Pediatrics

## 2017-02-15 DIAGNOSIS — B379 Candidiasis, unspecified: Secondary | ICD-10-CM

## 2017-02-15 NOTE — Telephone Encounter (Signed)
Pt called in today requesting refill for yeast infection medication. Please advise

## 2017-02-16 ENCOUNTER — Other Ambulatory Visit: Payer: Self-pay | Admitting: Certified Nurse Midwife

## 2017-02-16 DIAGNOSIS — B379 Candidiasis, unspecified: Secondary | ICD-10-CM

## 2017-02-17 MED ORDER — TERCONAZOLE 0.8 % VA CREA: 1.0000 | TOPICAL_CREAM | Freq: Every day | VAGINAL | 0 refills | Status: DC

## 2017-02-25 ENCOUNTER — Encounter: Payer: Self-pay | Admitting: Obstetrics and Gynecology

## 2017-02-25 ENCOUNTER — Ambulatory Visit (INDEPENDENT_AMBULATORY_CARE_PROVIDER_SITE_OTHER): Payer: PRIVATE HEALTH INSURANCE | Admitting: Obstetrics and Gynecology

## 2017-02-25 VITALS — BP 114/76 | HR 97 | Wt 170.0 lb

## 2017-02-25 DIAGNOSIS — Z3480 Encounter for supervision of other normal pregnancy, unspecified trimester: Secondary | ICD-10-CM

## 2017-02-25 DIAGNOSIS — Z3482 Encounter for supervision of other normal pregnancy, second trimester: Secondary | ICD-10-CM

## 2017-02-25 DIAGNOSIS — O9989 Other specified diseases and conditions complicating pregnancy, childbirth and the puerperium: Secondary | ICD-10-CM

## 2017-02-25 DIAGNOSIS — Z2839 Other underimmunization status: Secondary | ICD-10-CM

## 2017-02-25 DIAGNOSIS — Z283 Underimmunization status: Secondary | ICD-10-CM

## 2017-02-25 MED ORDER — FLUCONAZOLE 150 MG PO TABS: 150.0000 mg | ORAL_TABLET | Freq: Once | ORAL | 0 refills | Status: AC

## 2017-02-25 NOTE — Progress Notes (Signed)
Pt states she has a cold today, has only tried nose spray.

## 2017-02-26 NOTE — Progress Notes (Signed)
   PRENATAL VISIT NOTE  Subjective:  Evelyn Kennedy is a 26 y.o. G3P1011 at 3651w1d being seen today for ongoing prenatal care.  She is currently monitored for the following issues for this low-risk pregnancy and has PCOS (polycystic ovarian syndrome); Encounter for supervision of normal pregnancy, unspecified, unspecified trimester; and Rubella non-immune status, antepartum on their problem list.  Patient reports no complaints.  Contractions: Not present. Vag. Bleeding: None.  Movement: Present. Denies leaking of fluid.   The following portions of the patient's history were reviewed and updated as appropriate: allergies, current medications, past family history, past medical history, past social history, past surgical history and problem list. Problem list updated.  Objective:   Vitals:   02/25/17 0939  BP: 114/76  Pulse: 97  Weight: 170 lb (77.1 kg)    Fetal Status: Fetal Heart Rate (bpm): 155 Fundal Height: 24 cm Movement: Present     General:  Alert, oriented and cooperative. Patient is in no acute distress.  Skin: Skin is warm and dry. No rash noted.   Cardiovascular: Normal heart rate noted  Respiratory: Normal respiratory effort, no problems with respiration noted  Abdomen: Soft, gravid, appropriate for gestational age.  Pain/Pressure: Present     Pelvic: Cervical exam deferred        Extremities: Normal range of motion.     Mental Status:  Normal mood and affect. Normal behavior. Normal judgment and thought content.   Assessment and Plan:  Pregnancy: G3P1011 at 251w1d  1. Supervision of other normal pregnancy, antepartum Patient is doing well without complaints Third trimester labs and glucola next visit  2. Rubella non-immune status, antepartum Will offer pp  Preterm labor symptoms and general obstetric precautions including but not limited to vaginal bleeding, contractions, leaking of fluid and fetal movement were reviewed in detail with the patient. Please refer to  After Visit Summary for other counseling recommendations.  Return in about 4 weeks (around 03/25/2017) for 2 hr glucola next visit, ROB.   Catalina AntiguaPeggy Deveion Denz, MD

## 2017-03-25 ENCOUNTER — Other Ambulatory Visit: Payer: Medicaid Other

## 2017-03-25 ENCOUNTER — Ambulatory Visit (INDEPENDENT_AMBULATORY_CARE_PROVIDER_SITE_OTHER): Payer: Medicaid Other | Admitting: Obstetrics and Gynecology

## 2017-03-25 ENCOUNTER — Encounter: Payer: Self-pay | Admitting: Obstetrics and Gynecology

## 2017-03-25 VITALS — BP 109/65 | HR 91 | Wt 178.0 lb

## 2017-03-25 DIAGNOSIS — Z3483 Encounter for supervision of other normal pregnancy, third trimester: Secondary | ICD-10-CM

## 2017-03-25 DIAGNOSIS — Z3480 Encounter for supervision of other normal pregnancy, unspecified trimester: Secondary | ICD-10-CM

## 2017-03-25 DIAGNOSIS — Z283 Underimmunization status: Secondary | ICD-10-CM

## 2017-03-25 DIAGNOSIS — Z2839 Other underimmunization status: Secondary | ICD-10-CM

## 2017-03-25 DIAGNOSIS — Z23 Encounter for immunization: Secondary | ICD-10-CM | POA: Diagnosis not present

## 2017-03-25 DIAGNOSIS — O9989 Other specified diseases and conditions complicating pregnancy, childbirth and the puerperium: Secondary | ICD-10-CM

## 2017-03-25 NOTE — Progress Notes (Signed)
PRENATAL VISIT NOTE  Subjective:  Evelyn Kennedy is a 26 y.o. G3P1011 at 22w0dbeing seen today for ongoing prenatal care.  She is currently monitored for the following issues for this low-risk pregnancy and has PCOS (polycystic ovarian syndrome); Encounter for supervision of normal pregnancy, unspecified, unspecified trimester; and Rubella non-immune status, antepartum on their problem list.  Patient reports occasional mild cramping.  Contractions: Irregular. Vag. Bleeding: None.  Movement: Present. Denies leaking of fluid.   The following portions of the patient's history were reviewed and updated as appropriate: allergies, current medications, past family history, past medical history, past social history, past surgical history and problem list. Problem list updated.  Objective:   Vitals:   03/25/17 0939  BP: 109/65  Pulse: 91  Weight: 178 lb (80.7 kg)    Fetal Status: Fetal Heart Rate (bpm): 156   Movement: Present     General:  Alert, oriented and cooperative. Patient is in no acute distress.  Skin: Skin is warm and dry. No rash noted.   Cardiovascular: Normal heart rate noted  Respiratory: Normal respiratory effort, no problems with respiration noted  Abdomen: Soft, gravid, appropriate for gestational age.  Pain/Pressure: Present     Pelvic: Cervical exam deferred        Extremities: Normal range of motion.  Edema: None  Mental Status:  Normal mood and affect. Normal behavior. Normal judgment and thought content.   Assessment and Plan:  Pregnancy: G3P1011 at 216w0d1. Supervision of other normal pregnancy, antepartum Counseled regarding risks/benefits of flu vaccine, patient declines vaccine.  Counseled regarding risks/benefits of Tdap vaccine, patient accepts vaccine.   2. Rubella non-immune status, antepartum MMR pp  Preterm labor symptoms and general obstetric precautions including but not limited to vaginal bleeding, contractions, leaking of fluid and fetal  movement were reviewed in detail with the patient. Please refer to After Visit Summary for other counseling recommendations.  Return in about 2 weeks (around 04/08/2017) for OB visit.   KeSloan LeiterMD

## 2017-03-26 LAB — CBC
HEMATOCRIT: 36.9 % (ref 34.0–46.6)
HEMOGLOBIN: 11.8 g/dL (ref 11.1–15.9)
MCH: 30 pg (ref 26.6–33.0)
MCHC: 32 g/dL (ref 31.5–35.7)
MCV: 94 fL (ref 79–97)
Platelets: 237 10*3/uL (ref 150–379)
RBC: 3.93 x10E6/uL (ref 3.77–5.28)
RDW: 14.1 % (ref 12.3–15.4)
WBC: 10.5 10*3/uL (ref 3.4–10.8)

## 2017-03-26 LAB — GLUCOSE TOLERANCE, 2 HOURS W/ 1HR
GLUCOSE, 1 HOUR: 107 mg/dL (ref 65–179)
Glucose, 2 hour: 88 mg/dL (ref 65–152)
Glucose, Fasting: 82 mg/dL (ref 65–91)

## 2017-03-26 LAB — HIV ANTIBODY (ROUTINE TESTING W REFLEX): HIV SCREEN 4TH GENERATION: NONREACTIVE

## 2017-03-26 LAB — RPR: RPR: NONREACTIVE

## 2017-04-08 ENCOUNTER — Encounter: Payer: Self-pay | Admitting: Obstetrics and Gynecology

## 2017-04-08 ENCOUNTER — Ambulatory Visit (INDEPENDENT_AMBULATORY_CARE_PROVIDER_SITE_OTHER): Payer: PRIVATE HEALTH INSURANCE | Admitting: Obstetrics and Gynecology

## 2017-04-08 VITALS — BP 108/70 | HR 101 | Wt 181.0 lb

## 2017-04-08 DIAGNOSIS — R030 Elevated blood-pressure reading, without diagnosis of hypertension: Secondary | ICD-10-CM

## 2017-04-08 DIAGNOSIS — Z3483 Encounter for supervision of other normal pregnancy, third trimester: Secondary | ICD-10-CM

## 2017-04-08 DIAGNOSIS — Z3480 Encounter for supervision of other normal pregnancy, unspecified trimester: Secondary | ICD-10-CM

## 2017-04-08 DIAGNOSIS — Z283 Underimmunization status: Secondary | ICD-10-CM

## 2017-04-08 DIAGNOSIS — Z2839 Other underimmunization status: Secondary | ICD-10-CM

## 2017-04-08 DIAGNOSIS — O9989 Other specified diseases and conditions complicating pregnancy, childbirth and the puerperium: Secondary | ICD-10-CM

## 2017-04-08 NOTE — Progress Notes (Signed)
   PRENATAL VISIT NOTE  Subjective:  Evelyn Kennedy is a 26 y.o. G3P1011 at 73w0dbeing seen today for ongoing prenatal care.  She is currently monitored for the following issues for this low-risk pregnancy and has PCOS (polycystic ovarian syndrome); Encounter for supervision of normal pregnancy, unspecified, unspecified trimester; and Rubella non-immune status, antepartum on their problem list.  Patient reports no complaints.  Contractions: Irregular. Vag. Bleeding: None.  Movement: Present. Denies leaking of fluid.   The following portions of the patient's history were reviewed and updated as appropriate: allergies, current medications, past family history, past medical history, past social history, past surgical history and problem list. Problem list updated.  Objective:   Vitals:   04/08/17 1027 04/08/17 1028  BP: (!) 150/87 108/70  Pulse: (!) 101   Weight: 181 lb (82.1 kg)     Fetal Status: Fetal Heart Rate (bpm): 158 Fundal Height: 31 cm Movement: Present     General:  Alert, oriented and cooperative. Patient is in no acute distress.  Skin: Skin is warm and dry. No rash noted.   Cardiovascular: Normal heart rate noted  Respiratory: Normal respiratory effort, no problems with respiration noted  Abdomen: Soft, gravid, appropriate for gestational age.  Pain/Pressure: Present  Vertex by palpation   Pelvic: Cervical exam deferred        Extremities: Normal range of motion.  Edema: None  Mental Status:  Normal mood and affect. Normal behavior. Normal judgment and thought content.   Assessment and Plan:  Pregnancy: G3P1011 at 363w0d1. Supervision of other normal pregnancy, antepartum  2. Elevated BP without diagnosis of hypertension Repeat wnl Asymptomatic, patient reports she was laughing when first BP was taken - Protein / creatinine ratio, urine - Comp Met (CMET) - CBC Reviewed s/s pre-eclampsia and precautions. Patient verbalizes understanding.  3. Rubella non-immune  status, antepartum Needs MMR pp  Preterm labor symptoms and general obstetric precautions including but not limited to vaginal bleeding, contractions, leaking of fluid and fetal movement were reviewed in detail with the patient. Please refer to After Visit Summary for other counseling recommendations.  Return in about 2 weeks (around 04/22/2017) for OB visit (MD).   KeSloan LeiterMD

## 2017-04-09 LAB — COMPREHENSIVE METABOLIC PANEL
ALBUMIN: 4 g/dL (ref 3.5–5.5)
ALT: 20 IU/L (ref 0–32)
AST: 19 IU/L (ref 0–40)
Albumin/Globulin Ratio: 1.3 (ref 1.2–2.2)
Alkaline Phosphatase: 103 IU/L (ref 39–117)
BUN/Creatinine Ratio: 17 (ref 9–23)
BUN: 10 mg/dL (ref 6–20)
Bilirubin Total: <0.2 mg/dL (ref 0.0–1.2)
CALCIUM: 8.9 mg/dL (ref 8.7–10.2)
CO2: 21 mmol/L (ref 20–29)
CREATININE: 0.6 mg/dL (ref 0.57–1.00)
Chloride: 104 mmol/L (ref 96–106)
GFR calc Af Amer: 145 mL/min/{1.73_m2} (ref >59)
GFR, EST NON AFRICAN AMERICAN: 126 mL/min/{1.73_m2} (ref >59)
GLOBULIN, TOTAL: 3 g/dL (ref 1.5–4.5)
Glucose: 80 mg/dL (ref 65–99)
Potassium: 4.1 mmol/L (ref 3.5–5.2)
SODIUM: 137 mmol/L (ref 134–144)
TOTAL PROTEIN: 7 g/dL (ref 6.0–8.5)

## 2017-04-09 LAB — CBC
HEMOGLOBIN: 12.1 g/dL (ref 11.1–15.9)
Hematocrit: 36.7 % (ref 34.0–46.6)
MCH: 30.7 pg (ref 26.6–33.0)
MCHC: 33 g/dL (ref 31.5–35.7)
MCV: 93 fL (ref 79–97)
PLATELETS: 255 10*3/uL (ref 150–379)
RBC: 3.94 x10E6/uL (ref 3.77–5.28)
RDW: 13.8 % (ref 12.3–15.4)
WBC: 11.5 10*3/uL — ABNORMAL HIGH (ref 3.4–10.8)

## 2017-04-09 LAB — PROTEIN / CREATININE RATIO, URINE
Creatinine, Urine: 97.2 mg/dL
PROTEIN UR: 12.9 mg/dL
Protein/Creat Ratio: 133 mg/g creat (ref 0–200)

## 2017-04-22 ENCOUNTER — Ambulatory Visit (INDEPENDENT_AMBULATORY_CARE_PROVIDER_SITE_OTHER): Payer: PRIVATE HEALTH INSURANCE | Admitting: Obstetrics and Gynecology

## 2017-04-22 ENCOUNTER — Encounter: Payer: Self-pay | Admitting: Obstetrics and Gynecology

## 2017-04-22 VITALS — BP 131/79 | HR 94 | Wt 181.4 lb

## 2017-04-22 DIAGNOSIS — O9989 Other specified diseases and conditions complicating pregnancy, childbirth and the puerperium: Secondary | ICD-10-CM

## 2017-04-22 DIAGNOSIS — O09899 Supervision of other high risk pregnancies, unspecified trimester: Secondary | ICD-10-CM

## 2017-04-22 DIAGNOSIS — Z3480 Encounter for supervision of other normal pregnancy, unspecified trimester: Secondary | ICD-10-CM

## 2017-04-22 DIAGNOSIS — Z283 Underimmunization status: Secondary | ICD-10-CM

## 2017-04-22 MED ORDER — CLOTRIMAZOLE 1 % VA CREA: 1.0000 | TOPICAL_CREAM | Freq: Every day | VAGINAL | 0 refills | Status: DC

## 2017-04-22 NOTE — Progress Notes (Signed)
Pt c/o lower abdominal pain. She states she has a yeast infection.

## 2017-04-22 NOTE — Progress Notes (Signed)
   PRENATAL VISIT NOTE  Subjective:  Evelyn Kennedy is a 27 y.o. G3P1011 at 54w0dbeing seen today for ongoing prenatal care.  She is currently monitored for the following issues for this low-risk pregnancy and has PCOS (polycystic ovarian syndrome); Encounter for supervision of normal pregnancy, unspecified, unspecified trimester; and Rubella non-immune status, antepartum on their problem list.  Patient reports minor pelvic cramping.  Contractions: Irregular. Vag. Bleeding: None.  Movement: Present. Denies leaking of fluid.   The following portions of the patient's history were reviewed and updated as appropriate: allergies, current medications, past family history, past medical history, past social history, past surgical history and problem list. Problem list updated.  Objective:   Vitals:   04/22/17 1532  BP: 131/79  Pulse: 94  Weight: 181 lb 6.4 oz (82.3 kg)    Fetal Status: Fetal Heart Rate (bpm): 152 Fundal Height: 32 cm Movement: Present     General:  Alert, oriented and cooperative. Patient is in no acute distress.  Skin: Skin is warm and dry. No rash noted.   Cardiovascular: Normal heart rate noted  Respiratory: Normal respiratory effort, no problems with respiration noted  Abdomen: Soft, gravid, appropriate for gestational age.  Pain/Pressure: Present     Pelvic: Cervical exam deferred        Extremities: Normal range of motion.  Edema: None  Mental Status:  Normal mood and affect. Normal behavior. Normal judgment and thought content.   Assessment and Plan:  Pregnancy: G3P1011 at 353w0d1. Supervision of other normal pregnancy, antepartum Counseled regarding risks/benefits of flu vaccine, patient declines vaccine.   2. Rubella non-immune status, antepartum MMR post partum  3. Yeast infection Starting to have symptoms Clotrimazole sent to pharmacy  Preterm labor symptoms and general obstetric precautions including but not limited to vaginal bleeding, contractions,  leaking of fluid and fetal movement were reviewed in detail with the patient. Please refer to After Visit Summary for other counseling recommendations.  Return in about 2 weeks (around 05/06/2017) for OB visit.   KeSloan LeiterMD

## 2017-05-06 ENCOUNTER — Ambulatory Visit (INDEPENDENT_AMBULATORY_CARE_PROVIDER_SITE_OTHER): Payer: PRIVATE HEALTH INSURANCE | Admitting: Certified Nurse Midwife

## 2017-05-06 ENCOUNTER — Encounter: Payer: Self-pay | Admitting: Certified Nurse Midwife

## 2017-05-06 VITALS — BP 118/67 | HR 88 | Wt 186.8 lb

## 2017-05-06 DIAGNOSIS — Z3483 Encounter for supervision of other normal pregnancy, third trimester: Secondary | ICD-10-CM

## 2017-05-06 DIAGNOSIS — Z2839 Other underimmunization status: Secondary | ICD-10-CM

## 2017-05-06 DIAGNOSIS — O9989 Other specified diseases and conditions complicating pregnancy, childbirth and the puerperium: Secondary | ICD-10-CM

## 2017-05-06 DIAGNOSIS — Z3480 Encounter for supervision of other normal pregnancy, unspecified trimester: Secondary | ICD-10-CM

## 2017-05-06 DIAGNOSIS — Z283 Underimmunization status: Secondary | ICD-10-CM

## 2017-05-06 NOTE — Progress Notes (Signed)
   PRENATAL VISIT NOTE  Subjective:  Evelyn Kennedy is a 27 y.o. G3P1011 at 52w0dbeing seen today for ongoing prenatal care.  She is currently monitored for the following issues for this low-risk pregnancy and has PCOS (polycystic ovarian syndrome); Encounter for supervision of normal pregnancy, unspecified, unspecified trimester; and Rubella non-immune status, antepartum on their problem list.  Patient reports no bleeding, no leaking and occasional contractions.  Contractions: Irregular. Vag. Bleeding: None.  Movement: Present. Denies leaking of fluid.   The following portions of the patient's history were reviewed and updated as appropriate: allergies, current medications, past family history, past medical history, past social history, past surgical history and problem list. Problem list updated.  Objective:   Vitals:   05/06/17 1048  BP: 118/67  Pulse: 88  Weight: 186 lb 12.8 oz (84.7 kg)    Fetal Status: Fetal Heart Rate (bpm): 145; doppler Fundal Height: 34 cm Movement: Present     General:  Alert, oriented and cooperative. Patient is in no acute distress.  Skin: Skin is warm and dry. No rash noted.   Cardiovascular: Normal heart rate noted  Respiratory: Normal respiratory effort, no problems with respiration noted  Abdomen: Soft, gravid, appropriate for gestational age.  Pain/Pressure: Absent     Pelvic: Cervical exam deferred        Extremities: Normal range of motion.  Edema: None  Mental Status:  Normal mood and affect. Normal behavior. Normal judgment and thought content.   Assessment and Plan:  Pregnancy: G3P1011 at 341w0d1. Supervision of other normal pregnancy, antepartum     Doing well  2. Rubella non-immune status, antepartum     MMR postpartum  Preterm labor symptoms and general obstetric precautions including but not limited to vaginal bleeding, contractions, leaking of fluid and fetal movement were reviewed in detail with the patient. Please refer to After  Visit Summary for other counseling recommendations.  Return in about 1 week (around 05/13/2017) for ROB, GBS.   RaMorene CrockerCNM

## 2017-05-06 NOTE — Progress Notes (Signed)
Pt c/o intermittent contractions

## 2017-05-13 ENCOUNTER — Ambulatory Visit (INDEPENDENT_AMBULATORY_CARE_PROVIDER_SITE_OTHER): Payer: PRIVATE HEALTH INSURANCE | Admitting: Certified Nurse Midwife

## 2017-05-13 ENCOUNTER — Encounter: Payer: Self-pay | Admitting: Certified Nurse Midwife

## 2017-05-13 ENCOUNTER — Other Ambulatory Visit (HOSPITAL_COMMUNITY)
Admission: RE | Admit: 2017-05-13 | Discharge: 2017-05-13 | Disposition: A | Discharge status: Home / Self Care | Payer: Medicaid Other | Source: Ambulatory Visit | Attending: Certified Nurse Midwife | Admitting: Certified Nurse Midwife

## 2017-05-13 VITALS — BP 118/72 | HR 106 | Wt 189.2 lb

## 2017-05-13 DIAGNOSIS — Z3490 Encounter for supervision of normal pregnancy, unspecified, unspecified trimester: Secondary | ICD-10-CM | POA: Insufficient documentation

## 2017-05-13 DIAGNOSIS — Z2839 Other underimmunization status: Secondary | ICD-10-CM

## 2017-05-13 DIAGNOSIS — O9989 Other specified diseases and conditions complicating pregnancy, childbirth and the puerperium: Secondary | ICD-10-CM

## 2017-05-13 DIAGNOSIS — Z283 Underimmunization status: Secondary | ICD-10-CM

## 2017-05-13 NOTE — Progress Notes (Signed)
   PRENATAL VISIT NOTE  Subjective:  Evelyn Kennedy is a 27 y.o. G3P1011 at 26w0dbeing seen today for ongoing prenatal care.  She is currently monitored for the following issues for this low-risk pregnancy and has PCOS (polycystic ovarian syndrome); Encounter for supervision of normal pregnancy, unspecified, unspecified trimester; and Rubella non-immune status, antepartum on their problem list.  Patient reports no complaints.  Contractions: Irregular. Vag. Bleeding: None.  Movement: Present. Denies leaking of fluid.   The following portions of the patient's history were reviewed and updated as appropriate: allergies, current medications, past family history, past medical history, past social history, past surgical history and problem list. Problem list updated.  Objective:   Vitals:   05/13/17 1043  BP: 118/72  Pulse: (!) 106  Weight: 189 lb 3.2 oz (85.8 kg)    Fetal Status: Fetal Heart Rate (bpm): 147; doppler Fundal Height: 36 cm Movement: Present  Presentation: Vertex  General:  Alert, oriented and cooperative. Patient is in no acute distress.  Skin: Skin is warm and dry. No rash noted.   Cardiovascular: Normal heart rate noted  Respiratory: Normal respiratory effort, no problems with respiration noted  Abdomen: Soft, gravid, appropriate for gestational age.  Pain/Pressure: Present     Pelvic: Cervical exam performed Dilation: Closed Effacement (%): 50 Station: -3  Extremities: Normal range of motion.  Edema: Trace  Mental Status:  Normal mood and affect. Normal behavior. Normal judgment and thought content.   Assessment and Plan:  Pregnancy: G3P1011 at 351w0d1. Encounter for supervision of normal pregnancy, antepartum, unspecified gravidity      Doing well - Culture, beta strep (group b only) - Cervicovaginal ancillary only  2. Rubella non-immune status, antepartum     MMR postpartum  Preterm labor symptoms and general obstetric precautions including but not limited to  vaginal bleeding, contractions, leaking of fluid and fetal movement were reviewed in detail with the patient. Please refer to After Visit Summary for other counseling recommendations.  Return in about 1 week (around 05/20/2017) for ROMaybee  RaMorene CrockerCNM

## 2017-05-13 NOTE — Patient Instructions (Addendum)
Third Trimester of Pregnancy The third trimester is from week 29 through week 42, months 7 through 9. This trimester is when your unborn baby (fetus) is growing very fast. At the end of the ninth month, the unborn baby is about 20 inches in length. It weighs about 6-10 pounds. Follow these instructions at home:  Avoid all smoking, herbs, and alcohol. Avoid drugs not approved by your doctor.  Do not use any tobacco products, including cigarettes, chewing tobacco, and electronic cigarettes. If you need help quitting, ask your doctor. You may get counseling or other support to help you quit.  Only take medicine as told by your doctor. Some medicines are safe and some are not during pregnancy.  Exercise only as told by your doctor. Stop exercising if you start having cramps.  Eat regular, healthy meals.  Wear a good support bra if your breasts are tender.  Do not use hot tubs, steam rooms, or saunas.  Wear your seat belt when driving.  Avoid raw meat, uncooked cheese, and liter boxes and soil used by cats.  Take your prenatal vitamins.  Take 1500-2000 milligrams of calcium daily starting at the 20th week of pregnancy until you deliver your baby.  Try taking medicine that helps you poop (stool softener) as needed, and if your doctor approves. Eat more fiber by eating fresh fruit, vegetables, and whole grains. Drink enough fluids to keep your pee (urine) clear or pale yellow.  Take warm water baths (sitz baths) to soothe pain or discomfort caused by hemorrhoids. Use hemorrhoid cream if your doctor approves.  If you have puffy, bulging veins (varicose veins), wear support hose. Raise (elevate) your feet for 15 minutes, 3-4 times a day. Limit salt in your diet.  Avoid heavy lifting, wear low heels, and sit up straight.  Rest with your legs raised if you have leg cramps or low back pain.  Visit your dentist if you have not gone during your pregnancy. Use a soft toothbrush to brush your  teeth. Be gentle when you floss.  You can have sex (intercourse) unless your doctor tells you not to.  Do not travel far distances unless you must. Only do so with your doctor's approval.  Take prenatal classes.  Practice driving to the hospital.  Pack your hospital bag.  Prepare the baby's room.  Go to your doctor visits. Get help if:  You are not sure if you are in labor or if your water has broken.  You are dizzy.  You have mild cramps or pressure in your lower belly (abdominal).  You have a nagging pain in your belly area.  You continue to feel sick to your stomach (nauseous), throw up (vomit), or have watery poop (diarrhea).  You have bad smelling fluid coming from your vagina.  You have pain with peeing (urination). Get help right away if:  You have a fever.  You are leaking fluid from your vagina.  You are spotting or bleeding from your vagina.  You have severe belly cramping or pain.  You lose or gain weight rapidly.  You have trouble catching your breath and have chest pain.  You notice sudden or extreme puffiness (swelling) of your face, hands, ankles, feet, or legs.  You have not felt the baby move in over an hour.  You have severe headaches that do not go away with medicine.  You have vision changes. This information is not intended to replace advice given to you by your health care provider. Make   sure you discuss any questions you have with your health care provider. Document Released: 07/01/2009 Document Revised: 09/12/2015 Document Reviewed: 06/07/2012 Elsevier Interactive Patient Education  2017 [employer]  Braxton Hicks Contractions Contractions of the uterus can occur throughout pregnancy, but they are not always a sign that you are in labor. You may have practice contractions called Braxton Hicks contractions. These false labor contractions are sometimes confused with true labor. What are Braxton Hicks contractions? Braxton Hicks  contractions are tightening movements that occur in the muscles of the uterus before labor. Unlike true labor contractions, these contractions do not result in opening (dilation) and thinning of the cervix. Toward the end of pregnancy (32-34 weeks), Braxton Hicks contractions can happen more often and may become stronger. These contractions are sometimes difficult to tell apart from true labor because they can be very uncomfortable. You should not feel embarrassed if you go to the hospital with false labor. Sometimes, the only way to tell if you are in true labor is for your health care provider to look for changes in the cervix. The health care provider will do a physical exam and may monitor your contractions. If you are not in true labor, the exam should show that your cervix is not dilating and your water has not broken. If there are other health problems associated with your pregnancy, it is completely safe for you to be sent home with false labor. You may continue to have Braxton Hicks contractions until you go into true labor. How to tell the difference between true labor and false labor True labor  Contractions last 30-70 seconds.  Contractions become very regular.  Discomfort is usually felt in the top of the uterus, and it spreads to the lower abdomen and low back.  Contractions do not go away with walking.  Contractions usually become more intense and increase in frequency.  The cervix dilates and gets thinner. False labor  Contractions are usually shorter and not as strong as true labor contractions.  Contractions are usually irregular.  Contractions are often felt in the front of the lower abdomen and in the groin.  Contractions may go away when you walk around or change positions while lying down.  Contractions get weaker and are shorter-lasting as time goes on.  The cervix usually does not dilate or become thin. Follow these instructions at home:  Take over-the-counter  and prescription medicines only as told by your health care provider.  Keep up with your usual exercises and follow other instructions from your health care provider.  Eat and drink lightly if you think you are going into labor.  If Braxton Hicks contractions are making you uncomfortable: ? Change your position from lying down or resting to walking, or change from walking to resting. ? Sit and rest in a tub of warm water. ? Drink enough fluid to keep your urine pale yellow. Dehydration may cause these contractions. ? Do slow and deep breathing several times an hour.  Keep all follow-up prenatal visits as told by your health care provider. This is important. Contact a health care provider if:  You have a fever.  You have continuous pain in your abdomen. Get help right away if:  Your contractions become stronger, more regular, and closer together.  You have fluid leaking or gushing from your vagina.  You pass blood-tinged mucus (bloody show).  You have bleeding from your vagina.  You have low back pain that you never had before.  You feel your baby's   head pushing down and causing pelvic pressure.  Your baby is not moving inside you as much as it used to. Summary  Contractions that occur before labor are called Braxton Hicks contractions, false labor, or practice contractions.  Braxton Hicks contractions are usually shorter, weaker, farther apart, and less regular than true labor contractions. True labor contractions usually become progressively stronger and regular and they become more frequent.  Manage discomfort from Braxton Hicks contractions by changing position, resting in a warm bath, drinking plenty of water, or practicing deep breathing. This information is not intended to replace advice given to you by your health care provider. Make sure you discuss any questions you have with your health care provider. Document Released: 08/20/2016 Document Revised: 08/20/2016  Document Reviewed: 08/20/2016 Elsevier Interactive Patient Education  2018 [employer]  Before Baby Comes Home Before your baby arrives it is important to:  Have all of the supplies that you will need to care for your baby.  Know where to go if there is an emergency.  Discuss the baby's arrival with other family members.  What supplies will I need?  It is recommended that you have the following supplies: Large Items  Crib.  Crib mattress.  Rear-facing infant car seat. If possible, have a trained professional check to make sure that it is installed correctly.  Feeding  6-8 bottles that are 4-5 oz in size.  6-8 nipples.  Bottle brush.  Sterilizer, or a large pan or kettle with a lid.  A way to boil and cool water.  If you will be breastfeeding: ? Breast pump. ? Nipple cream. ? Nursing bra. ? Breast pads. ? Breast shields.  If you will be formula feeding: ? Formula. ? Measuring cups. ? Measuring spoons.  Bathing  Mild baby soap and baby shampoo.  Petroleum jelly.  Soft cloth towel and washcloth.  Hooded towel.  Cotton balls.  Bath basin.  Other Supplies  Rectal thermometer.  Bulb syringe.  Baby wipes or washcloths for diaper changes.  Diaper bag.  Changing pad.  Clothing, including one-piece outfits and pajamas.  Baby nail clippers.  Receiving blankets.  Mattress pad and sheets for the crib.  Night-light for the baby's room.  Baby monitor.  2 or 3 pacifiers.  Either 24-36 cloth diapers and waterproof diaper covers or a box of disposable diapers. You may need to use as many as 10-12 diapers per day.  How do I prepare for an emergency? Prepare for an emergency by:  Knowing how to get to the nearest hospital.  Listing the phone numbers of your baby's health care providers near your home phone and in your cell phone.  How do I prepare my family?  Decide how to handle visitors.  If you have other children: ? Talk with  them about the baby coming home. Ask them how they feel about it. ? Read a book together about being a new big brother or sister. ? Find ways to let them help you prepare for the new baby. ? Have someone ready to care for them while you are in the hospital. This information is not intended to replace advice given to you by your health care provider. Make sure you discuss any questions you have with your health care provider. Document Released: 03/19/2008 Document Revised: 09/12/2015 Document Reviewed: 03/14/2014 Elsevier Interactive Patient Education  2018 [employer] Inc.  

## 2017-05-14 LAB — CERVICOVAGINAL ANCILLARY ONLY
Bacterial vaginitis: NEGATIVE
CANDIDA VAGINITIS: POSITIVE — AB
CHLAMYDIA, DNA PROBE: NEGATIVE
Neisseria Gonorrhea: NEGATIVE
TRICH (WINDOWPATH): NEGATIVE

## 2017-05-16 LAB — CULTURE, BETA STREP (GROUP B ONLY): Strep Gp B Culture: POSITIVE — AB

## 2017-05-17 ENCOUNTER — Other Ambulatory Visit: Payer: Self-pay | Admitting: Certified Nurse Midwife

## 2017-05-17 DIAGNOSIS — B951 Streptococcus, group B, as the cause of diseases classified elsewhere: Secondary | ICD-10-CM | POA: Insufficient documentation

## 2017-05-17 DIAGNOSIS — Z3480 Encounter for supervision of other normal pregnancy, unspecified trimester: Secondary | ICD-10-CM

## 2017-05-17 DIAGNOSIS — B3731 Acute candidiasis of vulva and vagina: Secondary | ICD-10-CM

## 2017-05-17 DIAGNOSIS — B373 Candidiasis of vulva and vagina: Secondary | ICD-10-CM

## 2017-05-17 MED ORDER — TERCONAZOLE 0.8 % VA CREA: 1.0000 | TOPICAL_CREAM | Freq: Every day | VAGINAL | 0 refills | Status: DC

## 2017-05-17 MED ORDER — FLUCONAZOLE 150 MG PO TABS: 150.0000 mg | ORAL_TABLET | Freq: Once | ORAL | 0 refills | Status: AC

## 2017-05-20 ENCOUNTER — Encounter: Payer: Self-pay | Admitting: Certified Nurse Midwife

## 2017-05-20 ENCOUNTER — Ambulatory Visit (INDEPENDENT_AMBULATORY_CARE_PROVIDER_SITE_OTHER): Payer: PRIVATE HEALTH INSURANCE | Admitting: Certified Nurse Midwife

## 2017-05-20 ENCOUNTER — Encounter: Payer: PRIVATE HEALTH INSURANCE | Admitting: Obstetrics and Gynecology

## 2017-05-20 VITALS — BP 131/74 | HR 92 | Wt 185.8 lb

## 2017-05-20 DIAGNOSIS — Z3483 Encounter for supervision of other normal pregnancy, third trimester: Secondary | ICD-10-CM

## 2017-05-20 DIAGNOSIS — O09899 Supervision of other high risk pregnancies, unspecified trimester: Secondary | ICD-10-CM

## 2017-05-20 DIAGNOSIS — B951 Streptococcus, group B, as the cause of diseases classified elsewhere: Secondary | ICD-10-CM

## 2017-05-20 DIAGNOSIS — Z3480 Encounter for supervision of other normal pregnancy, unspecified trimester: Secondary | ICD-10-CM

## 2017-05-20 DIAGNOSIS — Z3490 Encounter for supervision of normal pregnancy, unspecified, unspecified trimester: Secondary | ICD-10-CM

## 2017-05-20 DIAGNOSIS — O9989 Other specified diseases and conditions complicating pregnancy, childbirth and the puerperium: Secondary | ICD-10-CM

## 2017-05-20 DIAGNOSIS — Z283 Underimmunization status: Secondary | ICD-10-CM

## 2017-05-20 NOTE — Progress Notes (Signed)
Patient reports good fetal movement, denies pain. 

## 2017-05-20 NOTE — Progress Notes (Signed)
   PRENATAL VISIT NOTE  Subjective:  Evelyn Kennedy is a 27 y.o. G3P1011 at 65w0dbeing seen today for ongoing prenatal care.  She is currently monitored for the following issues for this low-risk pregnancy and has PCOS (polycystic ovarian syndrome); Encounter for supervision of normal pregnancy, unspecified, unspecified trimester; Rubella non-immune status, antepartum; and Positive GBS test on their problem list.  Patient reports no complaints.  Contractions: Irregular. Vag. Bleeding: None.  Movement: Present. Denies leaking of fluid.   The following portions of the patient's history were reviewed and updated as appropriate: allergies, current medications, past family history, past medical history, past social history, past surgical history and problem list. Problem list updated.  Objective:   Vitals:   05/20/17 1319  BP: 131/74  Pulse: 92  Weight: 185 lb 12.8 oz (84.3 kg)    Fetal Status: Fetal Heart Rate (bpm): 152; doppler Fundal Height: 38 cm Movement: Present     General:  Alert, oriented and cooperative. Patient is in no acute distress.  Skin: Skin is warm and dry. No rash noted.   Cardiovascular: Normal heart rate noted  Respiratory: Normal respiratory effort, no problems with respiration noted  Abdomen: Soft, gravid, appropriate for gestational age.  Pain/Pressure: Absent     Pelvic: Cervical exam deferred        Extremities: Normal range of motion.  Edema: Trace  Mental Status:  Normal mood and affect. Normal behavior. Normal judgment and thought content.   Assessment and Plan:  Pregnancy: G3P1011 at 360w0d1. Supervision of other normal pregnancy, antepartum     Doing well  2. Positive GBS test     PCN for labor/delivery  3. Rubella non-immune status, antepartum     MMR postpartum  Preterm labor symptoms and general obstetric precautions including but not limited to vaginal bleeding, contractions, leaking of fluid and fetal movement were reviewed in detail with  the patient. Please refer to After Visit Summary for other counseling recommendations.  Return in about 1 week (around 05/27/2017) for ROB.   RaMorene CrockerCNM

## 2017-05-27 ENCOUNTER — Encounter: Payer: Self-pay | Admitting: Certified Nurse Midwife

## 2017-05-27 ENCOUNTER — Encounter: Payer: Self-pay | Admitting: Pediatrics

## 2017-05-27 ENCOUNTER — Ambulatory Visit (INDEPENDENT_AMBULATORY_CARE_PROVIDER_SITE_OTHER): Payer: PRIVATE HEALTH INSURANCE | Admitting: Certified Nurse Midwife

## 2017-05-27 VITALS — BP 126/73 | HR 96 | Wt 186.1 lb

## 2017-05-27 DIAGNOSIS — O9989 Other specified diseases and conditions complicating pregnancy, childbirth and the puerperium: Secondary | ICD-10-CM

## 2017-05-27 DIAGNOSIS — Z2839 Other underimmunization status: Secondary | ICD-10-CM

## 2017-05-27 DIAGNOSIS — Z3480 Encounter for supervision of other normal pregnancy, unspecified trimester: Secondary | ICD-10-CM

## 2017-05-27 DIAGNOSIS — Z283 Underimmunization status: Secondary | ICD-10-CM

## 2017-05-27 DIAGNOSIS — B951 Streptococcus, group B, as the cause of diseases classified elsewhere: Secondary | ICD-10-CM

## 2017-05-27 NOTE — Progress Notes (Signed)
Pt c/o intermittent pelvic pressure, usually after working.

## 2017-05-27 NOTE — Progress Notes (Signed)
   PRENATAL VISIT NOTE  Subjective:  Evelyn Kennedy is a 27 y.o. G3P1011 at 39w0dbeing seen today for ongoing prenatal care.  She is currently monitored for the following issues for this low-risk pregnancy and has PCOS (polycystic ovarian syndrome); Encounter for supervision of normal pregnancy, unspecified, unspecified trimester; Rubella non-immune status, antepartum; and Positive GBS test on their problem list.  Patient reports no bleeding, no contractions, no leaking and occasional contractions.  Contractions: Irregular. Vag. Bleeding: None.  Movement: Present. Denies leaking of fluid.   The following portions of the patient's history were reviewed and updated as appropriate: allergies, current medications, past family history, past medical history, past social history, past surgical history and problem list. Problem list updated.  Objective:   Vitals:   05/27/17 1038  BP: 126/73  Pulse: 96  Weight: 186 lb 1.6 oz (84.4 kg)    Fetal Status: Fetal Heart Rate (bpm): 150; doppler Fundal Height: 37 cm Movement: Present  Presentation: Vertex  General:  Alert, oriented and cooperative. Patient is in no acute distress.  Skin: Skin is warm and dry. No rash noted.   Cardiovascular: Normal heart rate noted  Respiratory: Normal respiratory effort, no problems with respiration noted  Abdomen: Soft, gravid, appropriate for gestational age.  Pain/Pressure: Present     Pelvic: Cervical exam performed Dilation: 1 Effacement (%): Thick Station: -3  Extremities: Normal range of motion.  Edema: None  Mental Status:  Normal mood and affect. Normal behavior. Normal judgment and thought content.   Assessment and Plan:  Pregnancy: G3P1011 at 379w0d1. Supervision of other normal pregnancy, antepartum     Doing well.    2. Rubella non-immune status, antepartum     MMR postpartum  3. Positive GBS test      PCN for labor/delivery  Term labor symptoms and general obstetric precautions including but  not limited to vaginal bleeding, contractions, leaking of fluid and fetal movement were reviewed in detail with the patient. Please refer to After Visit Summary for other counseling recommendations.  Return in about 1 week (around 06/03/2017) [place]  RaMorene CrockerCNM

## 2017-06-03 ENCOUNTER — Other Ambulatory Visit: Payer: Self-pay

## 2017-06-03 ENCOUNTER — Encounter: Payer: Self-pay | Admitting: Certified Nurse Midwife

## 2017-06-03 ENCOUNTER — Ambulatory Visit (INDEPENDENT_AMBULATORY_CARE_PROVIDER_SITE_OTHER): Payer: PRIVATE HEALTH INSURANCE | Admitting: Certified Nurse Midwife

## 2017-06-03 VITALS — BP 123/74 | HR 91 | Wt 189.4 lb

## 2017-06-03 DIAGNOSIS — Z2839 Other underimmunization status: Secondary | ICD-10-CM

## 2017-06-03 DIAGNOSIS — O9989 Other specified diseases and conditions complicating pregnancy, childbirth and the puerperium: Secondary | ICD-10-CM

## 2017-06-03 DIAGNOSIS — Z3480 Encounter for supervision of other normal pregnancy, unspecified trimester: Secondary | ICD-10-CM

## 2017-06-03 DIAGNOSIS — Z3483 Encounter for supervision of other normal pregnancy, third trimester: Secondary | ICD-10-CM

## 2017-06-03 DIAGNOSIS — Z283 Underimmunization status: Secondary | ICD-10-CM

## 2017-06-03 DIAGNOSIS — Z3490 Encounter for supervision of normal pregnancy, unspecified, unspecified trimester: Secondary | ICD-10-CM

## 2017-06-03 DIAGNOSIS — B951 Streptococcus, group B, as the cause of diseases classified elsewhere: Secondary | ICD-10-CM

## 2017-06-03 NOTE — Progress Notes (Signed)
   PRENATAL VISIT NOTE  Subjective:  Evelyn Kennedy is a 27 y.o. G3P1011 at 3w0dbeing seen today for ongoing prenatal care.  She is currently monitored for the following issues for this low-risk pregnancy and has PCOS (polycystic ovarian syndrome); Encounter for supervision of normal pregnancy, unspecified, unspecified trimester; Rubella non-immune status, antepartum; and Positive GBS test on their problem list.  Patient reports no complaints.  Contractions: Irregular. Vag. Bleeding: None.  Movement: Present. Denies leaking of fluid.   The following portions of the patient's history were reviewed and updated as appropriate: allergies, current medications, past family history, past medical history, past social history, past surgical history and problem list. Problem list updated.  Objective:   Vitals:   06/03/17 0855  BP: 123/74  Pulse: 91  Weight: 189 lb 6.4 oz (85.9 kg)    Fetal Status: Fetal Heart Rate (bpm): 146; doppler Fundal Height: 39 cm Movement: Present  Presentation: Vertex  General:  Alert, oriented and cooperative. Patient is in no acute distress.  Skin: Skin is warm and dry. No rash noted.   Cardiovascular: Normal heart rate noted  Respiratory: Normal respiratory effort, no problems with respiration noted  Abdomen: Soft, gravid, appropriate for gestational age.  Pain/Pressure: Present     Pelvic: Cervical exam performed Dilation: 1 Effacement (%): Thick Station: -3  Extremities: Normal range of motion.  Edema: Trace  Mental Status:  Normal mood and affect. Normal behavior. Normal judgment and thought content.   Assessment and Plan:  Pregnancy: G3P1011 at 38w0d1. Supervision of other normal pregnancy, antepartum     Doing well  2. Rubella non-immune status, antepartum     MMR postpartum  3. Positive GBS test      PCN for labor/delivery  Term labor symptoms and general obstetric precautions including but not limited to vaginal bleeding, contractions, leaking  of fluid and fetal movement were reviewed in detail with the patient. Please refer to After Visit Summary for other counseling recommendations.  Return in about 1 week (around 06/10/2017) for [place]  RaMorene CrockerCNM

## 2017-06-03 NOTE — Patient Instructions (Addendum)
Braxton Hicks Contractions °Contractions of the uterus can occur throughout pregnancy, but they are not always a sign that you are in labor. You may have practice contractions called Braxton Hicks contractions. These false labor contractions are sometimes confused with true labor. °What are Braxton Hicks contractions? °Braxton Hicks contractions are tightening movements that occur in the muscles of the uterus before labor. Unlike true labor contractions, these contractions do not result in opening (dilation) and thinning of the cervix. Toward the end of pregnancy (32-34 weeks), Braxton Hicks contractions can happen more often and may become stronger. These contractions are sometimes difficult to tell apart from true labor because they can be very uncomfortable. You should not feel embarrassed if you go to the hospital with false labor. °Sometimes, the only way to tell if you are in true labor is for your health care provider to look for changes in the cervix. The health care provider will do a physical exam and may monitor your contractions. If you are not in true labor, the exam should show that your cervix is not dilating and your water has not broken. °If there are other health problems associated with your pregnancy, it is completely safe for you to be sent home with false labor. You may continue to have Braxton Hicks contractions until you go into true labor. °How to tell the difference between true labor and false labor °True labor °· Contractions last 30-70 seconds. °· Contractions become very regular. °· Discomfort is usually felt in the top of the uterus, and it spreads to the lower abdomen and low back. °· Contractions do not go away with walking. °· Contractions usually become more intense and increase in frequency. °· The cervix dilates and gets thinner. °False labor °· Contractions are usually shorter and not as strong as true labor contractions. °· Contractions are usually irregular. °· Contractions  are often felt in the front of the lower abdomen and in the groin. °· Contractions may go away when you walk around or change positions while lying down. °· Contractions get weaker and are shorter-lasting as time goes on. °· The cervix usually does not dilate or become thin. °Follow these instructions at home: °· Take over-the-counter and prescription medicines only as told by your health care provider. °· Keep up with your usual exercises and follow other instructions from your health care provider. °· Eat and drink lightly if you think you are going into labor. °· If Braxton Hicks contractions are making you uncomfortable: °? Change your position from lying down or resting to walking, or change from walking to resting. °? Sit and rest in a tub of warm water. °? Drink enough fluid to keep your urine pale yellow. Dehydration may cause these contractions. °? Do slow and deep breathing several times an hour. °· Keep all follow-up prenatal visits as told by your health care provider. This is important. °Contact a health care provider if: °· You have a fever. °· You have continuous pain in your abdomen. °Get help right away if: °· Your contractions become stronger, more regular, and closer together. °· You have fluid leaking or gushing from your vagina. °· You pass blood-tinged mucus (bloody show). °· You have bleeding from your vagina. °· You have low back pain that you never had before. °· You feel your baby’s head pushing down and causing pelvic pressure. °· Your baby is not moving inside you as much as it used to. °Summary °· Contractions that occur before labor are called Braxton   Hicks contractions, false labor, or practice contractions. °· Braxton Hicks contractions are usually shorter, weaker, farther apart, and less regular than true labor contractions. True labor contractions usually become progressively stronger and regular and they become more frequent. °· Manage discomfort from Braxton Hicks contractions by  changing position, resting in a warm bath, drinking plenty of water, or practicing deep breathing. °This information is not intended to replace advice given to you by your health care provider. Make sure you discuss any questions you have with your health care provider. °Document Released: 08/20/2016 Document Revised: 08/20/2016 Document Reviewed: 08/20/2016 °Elsevier Interactive Patient Education © 2018 Else[employer] Inc. ° °

## 2017-06-10 ENCOUNTER — Ambulatory Visit (INDEPENDENT_AMBULATORY_CARE_PROVIDER_SITE_OTHER): Payer: PRIVATE HEALTH INSURANCE | Admitting: Certified Nurse Midwife

## 2017-06-10 ENCOUNTER — Encounter: Payer: Self-pay | Admitting: Certified Nurse Midwife

## 2017-06-10 VITALS — BP 138/87 | HR 93 | Wt 191.2 lb

## 2017-06-10 DIAGNOSIS — Z3480 Encounter for supervision of other normal pregnancy, unspecified trimester: Secondary | ICD-10-CM

## 2017-06-10 DIAGNOSIS — Z283 Underimmunization status: Secondary | ICD-10-CM

## 2017-06-10 DIAGNOSIS — Z2839 Other underimmunization status: Secondary | ICD-10-CM

## 2017-06-10 DIAGNOSIS — O9989 Other specified diseases and conditions complicating pregnancy, childbirth and the puerperium: Secondary | ICD-10-CM

## 2017-06-10 DIAGNOSIS — B951 Streptococcus, group B, as the cause of diseases classified elsewhere: Secondary | ICD-10-CM

## 2017-06-10 NOTE — Progress Notes (Signed)
   PRENATAL VISIT NOTE  Subjective:  Evelyn Kennedy is a 27 y.o. G3P1011 at 24w0dbeing seen today for ongoing prenatal care.  She is currently monitored for the following issues for this low-risk pregnancy and has PCOS (polycystic ovarian syndrome); Encounter for supervision of normal pregnancy, unspecified, unspecified trimester; Rubella non-immune status, antepartum; and Positive GBS test on their problem list.  Patient reports no bleeding, no leaking and occasional contractions.  Contractions: Irregular. Vag. Bleeding: None.  Movement: Present. Denies leaking of fluid.   The following portions of the patient's history were reviewed and updated as appropriate: allergies, current medications, past family history, past medical history, past social history, past surgical history and problem list. Problem list updated.  Objective:   Vitals:   06/10/17 1021  BP: 138/87  Pulse: 93  Weight: 191 lb 3.2 oz (86.7 kg)    Fetal Status: Fetal Heart Rate (bpm): 142; doppler Fundal Height: 37 cm Movement: Present  Presentation: Vertex  General:  Alert, oriented and cooperative. Patient is in no acute distress.  Skin: Skin is warm and dry. No rash noted.   Cardiovascular: Normal heart rate noted  Respiratory: Normal respiratory effort, no problems with respiration noted  Abdomen: Soft, gravid, appropriate for gestational age.  Pain/Pressure: Present     Pelvic: Cervical exam performed Dilation: 1.5 Effacement (%): Thick Station: -2  Extremities: Normal range of motion.  Edema: Trace  Mental Status:  Normal mood and affect. Normal behavior. Normal judgment and thought content.   Assessment and Plan:  Pregnancy: G3P1011 at 385w0d1. Supervision of other normal pregnancy, antepartum     Doing well.    2. Rubella non-immune status, antepartum     MMR postpartum  3. Positive GBS test     PCN for labor/delivery  Term labor symptoms and general obstetric precautions including but not limited to  vaginal bleeding, contractions, leaking of fluid and fetal movement were reviewed in detail with the patient. Please refer to After Visit Summary for other counseling recommendations.  Return in about 1 week (around 06/17/2017) for ROB, NST.   RaMorene CrockerCNM

## 2017-06-13 ENCOUNTER — Inpatient Hospital Stay (HOSPITAL_COMMUNITY): Payer: Medicaid Other | Admitting: Anesthesiology

## 2017-06-13 ENCOUNTER — Encounter (HOSPITAL_COMMUNITY): Payer: Self-pay

## 2017-06-13 ENCOUNTER — Encounter (HOSPITAL_COMMUNITY)
Admission: AD | Disposition: A | Discharge status: Home / Self Care | Payer: Self-pay | Source: Ambulatory Visit | Attending: Obstetrics and Gynecology

## 2017-06-13 ENCOUNTER — Inpatient Hospital Stay (HOSPITAL_COMMUNITY)
Admission: AD | Admit: 2017-06-13 | Discharge: 2017-06-15 | DRG: 788 | Disposition: A | Discharge status: Home / Self Care | Payer: Medicaid Other | Source: Ambulatory Visit | Attending: Obstetrics and Gynecology | Admitting: Obstetrics and Gynecology

## 2017-06-13 ENCOUNTER — Other Ambulatory Visit: Payer: Self-pay

## 2017-06-13 DIAGNOSIS — Z3A39 39 weeks gestation of pregnancy: Secondary | ICD-10-CM

## 2017-06-13 DIAGNOSIS — Z3490 Encounter for supervision of normal pregnancy, unspecified, unspecified trimester: Secondary | ICD-10-CM

## 2017-06-13 DIAGNOSIS — Z3480 Encounter for supervision of other normal pregnancy, unspecified trimester: Secondary | ICD-10-CM

## 2017-06-13 DIAGNOSIS — O4292 Full-term premature rupture of membranes, unspecified as to length of time between rupture and onset of labor: Secondary | ICD-10-CM | POA: Diagnosis present

## 2017-06-13 DIAGNOSIS — O99824 Streptococcus B carrier state complicating childbirth: Secondary | ICD-10-CM | POA: Diagnosis not present

## 2017-06-13 DIAGNOSIS — Z98891 History of uterine scar from previous surgery: Secondary | ICD-10-CM

## 2017-06-13 LAB — CBC
HCT: 37.1 % (ref 36.0–46.0)
Hemoglobin: 12.4 g/dL (ref 12.0–15.0)
MCH: 30.6 pg (ref 26.0–34.0)
MCHC: 33.4 g/dL (ref 30.0–36.0)
MCV: 91.6 fL (ref 78.0–100.0)
PLATELETS: 218 10*3/uL (ref 150–400)
RBC: 4.05 MIL/uL (ref 3.87–5.11)
RDW: 13.7 % (ref 11.5–15.5)
WBC: 9.3 10*3/uL (ref 4.0–10.5)

## 2017-06-13 LAB — TYPE AND SCREEN
ABO/RH(D): O POS
ANTIBODY SCREEN: NEGATIVE

## 2017-06-13 LAB — POCT FERN TEST: POCT FERN TEST: POSITIVE

## 2017-06-13 LAB — CREATININE, SERUM: Creatinine, Ser: 0.54 mg/dL (ref 0.44–1.00)

## 2017-06-13 SURGERY — Surgical Case
Anesthesia: Spinal | Wound class: Clean Contaminated

## 2017-06-13 MED ORDER — SODIUM CHLORIDE 0.9% FLUSH: 3.0000 mL | INTRAVENOUS | Status: DC | PRN

## 2017-06-13 MED ORDER — KETOROLAC TROMETHAMINE 30 MG/ML IJ SOLN
30.0000 mg | Freq: Four times a day (QID) | INTRAMUSCULAR | Status: AC | PRN
  Administered 2017-06-13: 30 mg via INTRAMUSCULAR

## 2017-06-13 MED ORDER — OXYTOCIN 40 UNITS IN LACTATED RINGERS INFUSION - SIMPLE MED: 2.5000 [IU]/h | INTRAVENOUS | Status: DC

## 2017-06-13 MED ORDER — DIPHENHYDRAMINE HCL 25 MG PO CAPS: 25.0000 mg | ORAL_CAPSULE | Freq: Four times a day (QID) | ORAL | Status: DC | PRN

## 2017-06-13 MED ORDER — DIPHENHYDRAMINE HCL 50 MG/ML IJ SOLN
INTRAMUSCULAR | Status: AC
  Filled 2017-06-13: qty 1

## 2017-06-13 MED ORDER — SIMETHICONE 80 MG PO CHEW
80.0000 mg | CHEWABLE_TABLET | ORAL | Status: DC
  Administered 2017-06-13 – 2017-06-14 (×2): 80 mg via ORAL
  Filled 2017-06-13 (×2): qty 1

## 2017-06-13 MED ORDER — WITCH HAZEL-GLYCERIN EX PADS: 1.0000 "application " | MEDICATED_PAD | CUTANEOUS | Status: DC | PRN

## 2017-06-13 MED ORDER — KETOROLAC TROMETHAMINE 30 MG/ML IJ SOLN
INTRAMUSCULAR | Status: AC
  Administered 2017-06-13: 30 mg via INTRAMUSCULAR
  Filled 2017-06-13: qty 1

## 2017-06-13 MED ORDER — AZITHROMYCIN 500 MG IV SOLR
500.0000 mg | Freq: Once | INTRAVENOUS | Status: AC
  Administered 2017-06-13: 500 mg via INTRAVENOUS
  Filled 2017-06-13: qty 500

## 2017-06-13 MED ORDER — ACETAMINOPHEN 325 MG PO TABS
650.0000 mg | ORAL_TABLET | ORAL | Status: DC | PRN
  Administered 2017-06-15: 325 mg via ORAL
  Filled 2017-06-13: qty 2

## 2017-06-13 MED ORDER — OXYTOCIN BOLUS FROM INFUSION: 500.0000 mL | Freq: Once | INTRAVENOUS | Status: DC

## 2017-06-13 MED ORDER — ONDANSETRON HCL 4 MG/2ML IJ SOLN: 4.0000 mg | Freq: Three times a day (TID) | INTRAMUSCULAR | Status: DC | PRN

## 2017-06-13 MED ORDER — MEPERIDINE HCL 25 MG/ML IJ SOLN: 6.2500 mg | INTRAMUSCULAR | Status: DC | PRN

## 2017-06-13 MED ORDER — SENNOSIDES-DOCUSATE SODIUM 8.6-50 MG PO TABS
2.0000 | ORAL_TABLET | ORAL | Status: DC
  Administered 2017-06-13 – 2017-06-14 (×2): 2 via ORAL
  Filled 2017-06-13 (×2): qty 2

## 2017-06-13 MED ORDER — DIPHENHYDRAMINE HCL 25 MG PO CAPS: 25.0000 mg | ORAL_CAPSULE | ORAL | Status: DC | PRN

## 2017-06-13 MED ORDER — NALBUPHINE HCL 10 MG/ML IJ SOLN: 5.0000 mg | Freq: Once | INTRAMUSCULAR | Status: DC | PRN

## 2017-06-13 MED ORDER — MENTHOL 3 MG MT LOZG: 1.0000 | LOZENGE | OROMUCOSAL | Status: DC | PRN

## 2017-06-13 MED ORDER — ONDANSETRON HCL 4 MG/2ML IJ SOLN
INTRAMUSCULAR | Status: DC | PRN
  Administered 2017-06-13: 4 mg via INTRAVENOUS

## 2017-06-13 MED ORDER — ZOLPIDEM TARTRATE 5 MG PO TABS: 5.0000 mg | ORAL_TABLET | Freq: Every evening | ORAL | Status: DC | PRN

## 2017-06-13 MED ORDER — MORPHINE SULFATE (PF) 0.5 MG/ML IJ SOLN
INTRAMUSCULAR | Status: DC | PRN
  Administered 2017-06-13: .2 mg via INTRATHECAL

## 2017-06-13 MED ORDER — ONDANSETRON HCL 4 MG/2ML IJ SOLN: 4.0000 mg | Freq: Four times a day (QID) | INTRAMUSCULAR | Status: DC | PRN

## 2017-06-13 MED ORDER — NALOXONE HCL 0.4 MG/ML IJ SOLN: 0.4000 mg | INTRAMUSCULAR | Status: DC | PRN

## 2017-06-13 MED ORDER — COCONUT OIL OIL
1.0000 "application " | TOPICAL_OIL | Status: DC | PRN
  Administered 2017-06-15: 1 via TOPICAL
  Filled 2017-06-13: qty 120

## 2017-06-13 MED ORDER — ACETAMINOPHEN 325 MG PO TABS: 650.0000 mg | ORAL_TABLET | ORAL | Status: DC | PRN

## 2017-06-13 MED ORDER — PENICILLIN G POT IN DEXTROSE 60000 UNIT/ML IV SOLN
3.0000 10*6.[IU] | INTRAVENOUS | Status: DC
  Filled 2017-06-13 (×3): qty 50

## 2017-06-13 MED ORDER — NALOXONE HCL 4 MG/10ML IJ SOLN: 1.0000 ug/kg/h | INTRAVENOUS | Status: DC | PRN

## 2017-06-13 MED ORDER — OXYCODONE-ACETAMINOPHEN 5-325 MG PO TABS
1.0000 | ORAL_TABLET | ORAL | Status: DC | PRN
  Administered 2017-06-14 – 2017-06-15 (×2): 1 via ORAL
  Filled 2017-06-13 (×2): qty 1

## 2017-06-13 MED ORDER — LACTATED RINGERS IV SOLN
INTRAVENOUS | Status: DC
  Administered 2017-06-14: 02:00:00 via INTRAVENOUS

## 2017-06-13 MED ORDER — OXYCODONE-ACETAMINOPHEN 5-325 MG PO TABS: 2.0000 | ORAL_TABLET | ORAL | Status: DC | PRN

## 2017-06-13 MED ORDER — BUPIVACAINE IN DEXTROSE 0.75-8.25 % IT SOLN
INTRATHECAL | Status: AC
  Filled 2017-06-13: qty 2

## 2017-06-13 MED ORDER — CEFAZOLIN SODIUM-DEXTROSE 2-4 GM/100ML-% IV SOLN
2.0000 g | Freq: Once | INTRAVENOUS | Status: AC
  Administered 2017-06-13: 2 g via INTRAVENOUS
  Filled 2017-06-13: qty 100

## 2017-06-13 MED ORDER — LACTATED RINGERS IV SOLN
INTRAVENOUS | Status: DC | PRN
  Administered 2017-06-13: 16:00:00 via INTRAVENOUS

## 2017-06-13 MED ORDER — SIMETHICONE 80 MG PO CHEW: 80.0000 mg | CHEWABLE_TABLET | ORAL | Status: DC | PRN

## 2017-06-13 MED ORDER — SIMETHICONE 80 MG PO CHEW
80.0000 mg | CHEWABLE_TABLET | Freq: Three times a day (TID) | ORAL | Status: DC
  Administered 2017-06-14 – 2017-06-15 (×2): 80 mg via ORAL
  Filled 2017-06-13 (×2): qty 1

## 2017-06-13 MED ORDER — LIDOCAINE HCL (PF) 1 % IJ SOLN: 30.0000 mL | INTRAMUSCULAR | Status: DC | PRN

## 2017-06-13 MED ORDER — LACTATED RINGERS IV BOLUS (SEPSIS)
500.0000 mL | Freq: Once | INTRAVENOUS | Status: AC
  Administered 2017-06-13: 500 mL via INTRAVENOUS

## 2017-06-13 MED ORDER — LACTATED RINGERS IV SOLN
INTRAVENOUS | Status: DC
  Administered 2017-06-13 (×2): via INTRAVENOUS

## 2017-06-13 MED ORDER — SCOPOLAMINE 1 MG/3DAYS TD PT72
MEDICATED_PATCH | TRANSDERMAL | Status: DC | PRN
  Administered 2017-06-13: 1 via TRANSDERMAL

## 2017-06-13 MED ORDER — PHENYLEPHRINE HCL 10 MG/ML IJ SOLN
INTRAMUSCULAR | Status: DC | PRN
  Administered 2017-06-13 (×2): 40 ug via INTRAVENOUS

## 2017-06-13 MED ORDER — KETOROLAC TROMETHAMINE 30 MG/ML IJ SOLN: 30.0000 mg | Freq: Four times a day (QID) | INTRAMUSCULAR | Status: AC | PRN

## 2017-06-13 MED ORDER — SODIUM CHLORIDE 0.9 % IR SOLN
Status: DC | PRN
  Administered 2017-06-13: 1000 mL

## 2017-06-13 MED ORDER — OXYTOCIN 10 UNIT/ML IJ SOLN
INTRAMUSCULAR | Status: AC
  Filled 2017-06-13: qty 4

## 2017-06-13 MED ORDER — DIBUCAINE 1 % RE OINT: 1.0000 "application " | TOPICAL_OINTMENT | RECTAL | Status: DC | PRN

## 2017-06-13 MED ORDER — SCOPOLAMINE 1 MG/3DAYS TD PT72
MEDICATED_PATCH | TRANSDERMAL | Status: AC
  Filled 2017-06-13: qty 1

## 2017-06-13 MED ORDER — PHENYLEPHRINE 40 MCG/ML (10ML) SYRINGE FOR IV PUSH (FOR BLOOD PRESSURE SUPPORT)
PREFILLED_SYRINGE | INTRAVENOUS | Status: AC
  Filled 2017-06-13: qty 10

## 2017-06-13 MED ORDER — DIPHENHYDRAMINE HCL 50 MG/ML IJ SOLN
12.5000 mg | INTRAMUSCULAR | Status: DC | PRN
  Administered 2017-06-13: 12.5 mg via INTRAVENOUS

## 2017-06-13 MED ORDER — TETANUS-DIPHTH-ACELL PERTUSSIS 5-2.5-18.5 LF-MCG/0.5 IM SUSP: 0.5000 mL | Freq: Once | INTRAMUSCULAR | Status: DC

## 2017-06-13 MED ORDER — PRENATAL MULTIVITAMIN CH
1.0000 | ORAL_TABLET | Freq: Every day | ORAL | Status: DC
  Administered 2017-06-14 – 2017-06-15 (×2): 1 via ORAL
  Filled 2017-06-13 (×2): qty 1

## 2017-06-13 MED ORDER — PHENYLEPHRINE 8 MG IN D5W 100 ML (0.08MG/ML) PREMIX OPTIME
INJECTION | INTRAVENOUS | Status: AC
  Filled 2017-06-13: qty 100

## 2017-06-13 MED ORDER — OXYCODONE-ACETAMINOPHEN 5-325 MG PO TABS: 1.0000 | ORAL_TABLET | ORAL | Status: DC | PRN

## 2017-06-13 MED ORDER — PHENYLEPHRINE 40 MCG/ML (10ML) SYRINGE FOR IV PUSH (FOR BLOOD PRESSURE SUPPORT)
PREFILLED_SYRINGE | INTRAVENOUS | Status: AC
Start: 2017-06-13 — End: ?
  Filled 2017-06-13: qty 10

## 2017-06-13 MED ORDER — OXYTOCIN 10 UNIT/ML IJ SOLN
INTRAVENOUS | Status: DC | PRN
  Administered 2017-06-13: 40 [IU] via INTRAVENOUS

## 2017-06-13 MED ORDER — ONDANSETRON HCL 4 MG/2ML IJ SOLN
INTRAMUSCULAR | Status: AC
  Filled 2017-06-13: qty 2

## 2017-06-13 MED ORDER — OXYTOCIN 40 UNITS IN LACTATED RINGERS INFUSION - SIMPLE MED: 2.5000 [IU]/h | INTRAVENOUS | Status: AC

## 2017-06-13 MED ORDER — SCOPOLAMINE 1 MG/3DAYS TD PT72: 1.0000 | MEDICATED_PATCH | Freq: Once | TRANSDERMAL | Status: DC

## 2017-06-13 MED ORDER — SODIUM CHLORIDE 0.9 % IV SOLN
5.0000 10*6.[IU] | Freq: Once | INTRAVENOUS | Status: AC
  Administered 2017-06-13: 5 10*6.[IU] via INTRAVENOUS
  Filled 2017-06-13: qty 5

## 2017-06-13 MED ORDER — NALBUPHINE HCL 10 MG/ML IJ SOLN
5.0000 mg | INTRAMUSCULAR | Status: DC | PRN
  Administered 2017-06-14: 5 mg via SUBCUTANEOUS
  Filled 2017-06-13: qty 1

## 2017-06-13 MED ORDER — TERBUTALINE SULFATE 1 MG/ML IJ SOLN
INTRAMUSCULAR | Status: AC
  Administered 2017-06-13: 0.25 mg via SUBCUTANEOUS
  Filled 2017-06-13: qty 1

## 2017-06-13 MED ORDER — LACTATED RINGERS IV SOLN
500.0000 mL | INTRAVENOUS | Status: DC | PRN
  Administered 2017-06-13: 500 mL via INTRAVENOUS

## 2017-06-13 MED ORDER — NALBUPHINE HCL 10 MG/ML IJ SOLN: 5.0000 mg | INTRAMUSCULAR | Status: DC | PRN

## 2017-06-13 MED ORDER — MORPHINE SULFATE (PF) 0.5 MG/ML IJ SOLN
INTRAMUSCULAR | Status: AC
  Filled 2017-06-13: qty 10

## 2017-06-13 MED ORDER — PHENYLEPHRINE 8 MG IN D5W 100 ML (0.08MG/ML) PREMIX OPTIME
INJECTION | INTRAVENOUS | Status: DC | PRN
  Administered 2017-06-13: 60 ug/min via INTRAVENOUS

## 2017-06-13 MED ORDER — IBUPROFEN 600 MG PO TABS
600.0000 mg | ORAL_TABLET | Freq: Four times a day (QID) | ORAL | Status: DC
  Administered 2017-06-13 – 2017-06-15 (×7): 600 mg via ORAL
  Filled 2017-06-13 (×7): qty 1

## 2017-06-13 MED ORDER — TERBUTALINE SULFATE 1 MG/ML IJ SOLN
0.2500 mg | Freq: Once | INTRAMUSCULAR | Status: AC
  Administered 2017-06-13: 0.25 mg via SUBCUTANEOUS

## 2017-06-13 MED ORDER — SOD CITRATE-CITRIC ACID 500-334 MG/5ML PO SOLN
30.0000 mL | ORAL | Status: DC | PRN
  Administered 2017-06-13: 30 mL via ORAL
  Filled 2017-06-13: qty 15

## 2017-06-13 MED ORDER — ENOXAPARIN SODIUM 40 MG/0.4ML ~~LOC~~ SOLN
40.0000 mg | SUBCUTANEOUS | Status: DC
  Administered 2017-06-14 – 2017-06-15 (×2): 40 mg via SUBCUTANEOUS
  Filled 2017-06-13 (×3): qty 0.4

## 2017-06-13 MED ORDER — BUPIVACAINE IN DEXTROSE 0.75-8.25 % IT SOLN
INTRATHECAL | Status: DC | PRN
  Administered 2017-06-13: 1.6 mL via INTRATHECAL

## 2017-06-13 SURGICAL SUPPLY — 34 items
BENZOIN TINCTURE PRP APPL 2/3 (GAUZE/BANDAGES/DRESSINGS) ×3 IMPLANT
CHLORAPREP W/TINT 26ML (MISCELLANEOUS) ×3 IMPLANT
CLAMP CORD UMBIL (MISCELLANEOUS) IMPLANT
CLOSURE WOUND 1/2 X4 (GAUZE/BANDAGES/DRESSINGS) ×1
CLOTH BEACON ORANGE TIMEOUT ST (SAFETY) ×3 IMPLANT
DRSG OPSITE POSTOP 4X10 (GAUZE/BANDAGES/DRESSINGS) ×3 IMPLANT
ELECT REM PT RETURN 9FT ADLT (ELECTROSURGICAL) ×3
ELECTRODE REM PT RTRN 9FT ADLT (ELECTROSURGICAL) ×1 IMPLANT
EXTRACTOR VACUUM KIWI (MISCELLANEOUS) IMPLANT
GLOVE BIO SURGEON ST LM GN SZ9 (GLOVE) ×3 IMPLANT
GLOVE BIOGEL PI IND STRL 7.0 (GLOVE) ×1 IMPLANT
GLOVE BIOGEL PI IND STRL 9 (GLOVE) ×1 IMPLANT
GLOVE BIOGEL PI INDICATOR 7.0 (GLOVE) ×2
GLOVE BIOGEL PI INDICATOR 9 (GLOVE) ×2
GOWN STRL REUS W/TWL 2XL LVL3 (GOWN DISPOSABLE) ×3 IMPLANT
GOWN STRL REUS W/TWL LRG LVL3 (GOWN DISPOSABLE) ×3 IMPLANT
NEEDLE HYPO 25X5/8 SAFETYGLIDE (NEEDLE) IMPLANT
NS IRRIG 1000ML POUR BTL (IV SOLUTION) ×3 IMPLANT
PACK C SECTION WH (CUSTOM PROCEDURE TRAY) ×3 IMPLANT
PAD OB MATERNITY 4.3X12.25 (PERSONAL CARE ITEMS) ×3 IMPLANT
PENCIL SMOKE EVAC W/HOLSTER (ELECTROSURGICAL) ×3 IMPLANT
RTRCTR C-SECT PINK 25CM LRG (MISCELLANEOUS) IMPLANT
RTRCTR C-SECT PINK 34CM XLRG (MISCELLANEOUS) IMPLANT
STRIP CLOSURE SKIN 1/2X4 (GAUZE/BANDAGES/DRESSINGS) ×2 IMPLANT
SUT MNCRL 0 VIOLET CTX 36 (SUTURE) ×2 IMPLANT
SUT MONOCRYL 0 CTX 36 (SUTURE) ×4
SUT VIC AB 0 CT1 27 (SUTURE) ×2
SUT VIC AB 0 CT1 27XBRD ANBCTR (SUTURE) ×1 IMPLANT
SUT VIC AB 2-0 CT1 27 (SUTURE) ×2
SUT VIC AB 2-0 CT1 TAPERPNT 27 (SUTURE) ×1 IMPLANT
SUT VIC AB 4-0 KS 27 (SUTURE) ×3 IMPLANT
SYR BULB IRRIGATION 50ML (SYRINGE) IMPLANT
TOWEL OR 17X24 6PK STRL BLUE (TOWEL DISPOSABLE) ×3 IMPLANT
TRAY FOLEY BAG SILVER LF 14FR (SET/KITS/TRAYS/PACK) ×3 IMPLANT

## 2017-06-13 NOTE — Progress Notes (Addendum)
G3P1 @ 39.[redacted] wksga. Here dt SROM at 0830 clear. Denies bleeding. +FM noted SVE 3/40/-3  GBS + No allergies   1120 Fern + Provider notifed and ordered to admit  1125 Birthing suite informed and room assignment pending   1243: up to bathroom   1301: decel noted. Please see flowsheet for nursing interventions.   1332: provider made aware of prolong decel and baseline FHR change from 145 now to 125. No new orders received.   1335: Birthing charge nurse notified. Room assigned to 165  13339: Pt to birthing via wheelchair.

## 2017-06-13 NOTE — Anesthesia Preprocedure Evaluation (Signed)
Anesthesia Evaluation  Patient identified by MRN, date of birth, ID band Patient awake    Reviewed: Allergy & Precautions, NPO status , Patient's Chart, lab work & pertinent test results  Airway Mallampati: II  TM Distance: >3 FB Neck ROM: Full    Dental no notable dental hx.    Pulmonary neg pulmonary ROS,    Pulmonary exam normal breath sounds clear to auscultation       Cardiovascular negative cardio ROS Normal cardiovascular exam Rhythm:Regular Rate:Normal     Neuro/Psych negative neurological ROS     GI/Hepatic   Endo/Other  negative endocrine ROS  Renal/GU      Musculoskeletal   Abdominal   Peds  Hematology negative hematology ROS (+)   Anesthesia Other Findings   Reproductive/Obstetrics (+) Pregnancy                             Lab Results  Component Value Date   WBC 9.3 06/13/2017   HGB 12.4 06/13/2017   HCT 37.1 06/13/2017   MCV 91.6 06/13/2017   PLT 218 06/13/2017    Anesthesia Physical Anesthesia Plan  ASA: III  Anesthesia Plan: Spinal   Post-op Pain Management:    Induction:   PONV Risk Score and Plan: Treatment may vary due to age or medical condition  Airway Management Planned: Mask, Natural Airway and Nasal Cannula  Additional Equipment:   Intra-op Plan:   Post-operative Plan:   Informed Consent: I have reviewed the patients History and Physical, chart, labs and discussed the procedure including the risks, benefits and alternatives for the proposed anesthesia with the patient or authorized representative who has indicated his/her understanding and acceptance.   Dental advisory given  Plan Discussed with: CRNA  Anesthesia Plan Comments:         Anesthesia Quick Evaluation

## 2017-06-13 NOTE — Progress Notes (Signed)
Evaluated patient at bedside. She had a 7 minute prolonged deceleration in MAU prior to coming to L&D. Since that time patient has had multiple variable decelerations. I placed FSE and IUPC. Patient then had 3 minute prolonged deceleration. Dr. Emelda FearFerguson evaluated the patient in the room with me and recommended c-section due to non-reassuring fetal status. Terbutaline was given. FHT was up to 130s with mod variability.   The risks of cesarean section were discussed with the patient including but were not limited to: bleeding which may require transfusion or reoperation; infection which may require antibiotics; injury to bowel, bladder, ureters or other surrounding organs; injury to the fetus; need for additional procedures including hysterectomy in the event of a life-threatening hemorrhage; placental abnormalities wth subsequent pregnancies, incisional problems, thromboembolic phenomenon and other postoperative/anesthesia complications.   The patient concurred with the proposed plan, giving informed written consent for the procedure.   To OR when ready.  Rolm BookbinderAmber Trayden Brandy, DO

## 2017-06-13 NOTE — Transfer of Care (Signed)
Immediate Anesthesia Transfer of Care Note  Patient: Evelyn Kennedy  Procedure(s) Performed: CESAREAN SECTION (N/A )  Patient Location: PACU  Anesthesia Type:Spinal  Level of Consciousness: awake, alert  and oriented  Airway & Oxygen Therapy: Patient Spontanous Breathing  Post-op Assessment: Report given to RN and Post -op Vital signs reviewed and stable  Post vital signs: Reviewed and stable  Last Vitals:  Vitals:   06/13/17 1445 06/13/17 1656  BP: (!) 119/59   Pulse: 86   Resp: 18   Temp:  36.4 C  SpO2:      Last Pain:  Vitals:   06/13/17 1656  TempSrc: Oral  PainSc: 0-No pain      Patients Stated Pain Goal: 10 (06/13/17 1405)  Complications: No apparent anesthesia complications

## 2017-06-13 NOTE — Progress Notes (Addendum)
Dr houser at bedside. Reviewed interventions, warm fluid bolus, bear hugger, vital signs, and pt's movement. Dr Richardson LandryHouser okay for pt to move to MB. Charge OB OR RN made aware. No new orders given. Resumed care

## 2017-06-13 NOTE — Consult Note (Signed)
Neonatology Note:   Attendance at C-section:    I was asked by Dr. Emelda FearFerguson to attend this primary C/S at term due to [facility]. The mother is a G3P1A1 O pos, GBS pos with otherwise normal pregnancy. ROM 6.5 hours prior to delivery, fluid clear. She was treated with Pen G 4 hours prior to delivery. Infant vigorous with good spontaneous cry and tone. Delayed cord clamping was done. Needed bulb suctioning. Ap 9/9. Lungs clear to ausc in DR. Infant is able to remain with his mother for skin to skin time under nursing supervision. Transferred to the care of Pediatrician.   Doretha Souhristie C. Lillith Mcneff, MD

## 2017-06-13 NOTE — Anesthesia Procedure Notes (Signed)
Spinal  Patient location during procedure: OB Start time: 06/13/2017 3:35 PM End time: 06/13/2017 3:42 PM Staffing Anesthesiologist: Trevor IhaHouser, Stephen A, MD Performed: anesthesiologist  Preanesthetic Checklist Completed: patient identified, surgical consent, pre-op evaluation, timeout performed, IV checked, risks and benefits discussed and monitors and equipment checked Spinal Block Patient position: sitting Prep: site prepped and draped and DuraPrep Patient monitoring: heart rate, cardiac monitor, continuous pulse ox and blood pressure Approach: midline Location: L3-4 Injection technique: single-shot Needle Needle type: Pencan  Needle gauge: 24 G Needle length: 10 cm Assessment Sensory level: T4

## 2017-06-13 NOTE — Anesthesia Postprocedure Evaluation (Signed)
Anesthesia Post Note  Patient: Evelyn Kennedy  Procedure(s) Performed: CESAREAN SECTION (N/A )     Patient location during evaluation: PACU Anesthesia Type: Spinal Level of consciousness: oriented and awake and alert Pain management: pain level controlled Vital Signs Assessment: post-procedure vital signs reviewed and stable Respiratory status: spontaneous breathing, respiratory function stable and patient connected to nasal cannula oxygen Cardiovascular status: blood pressure returned to baseline and stable Postop Assessment: no headache, no backache and no apparent nausea or vomiting Anesthetic complications: no    Last Vitals:  Vitals:   06/13/17 1745 06/13/17 1800  BP: 106/62 111/66  Pulse: 82 74  Resp: (!) 22 (!) 22  Temp: (!) 35.7 C   SpO2: 97% 97%    Last Pain:  Vitals:   06/13/17 1800  TempSrc:   PainSc: Asleep   Pain Goal: Patients Stated Pain Goal: 1 (06/13/17 1745)               Trevor IhaStephen A Houser

## 2017-06-13 NOTE — H&P (Signed)
Evelyn Kennedy is a 27 y.o. female G3P1011 @ 39.3 wks presenting for * SROM @ 9:30. C/o mild contractions 4-7 min apart. OB History    Gravida Para Term Preterm AB Living   3 1 1   1 1    SAB TAB Ectopic Multiple Live Births     1     1     No past medical history on file. Past Surgical History:  Procedure Laterality Date  . TONSILLECTOMY     Family History: family history includes Diabetes in her maternal grandfather; Heart attack in her paternal grandfather; Hypertension in her father; Stroke in her maternal grandfather. Social History:  reports that  has never smoked. she has never used smokeless tobacco. She reports that she does not drink alcohol or use drugs.     Maternal Diabetes: No Genetic Screening: Normal Maternal Ultrasounds/Referrals: Normal Fetal Ultrasounds or other Referrals:  None Maternal Substance Abuse:  No Significant Maternal Medications:  None Significant Maternal Lab Results:  Lab values include: Group B Strep positive Other Comments:  None  Review of Systems  Constitutional: Negative.   HENT: Negative.   Eyes: Negative.   Respiratory: Negative.   Cardiovascular: Negative.   Gastrointestinal: Positive for abdominal pain.  Genitourinary: Negative.   Musculoskeletal: Negative.   Skin: Negative.   Neurological: Negative.   Endo/Heme/Allergies: Negative.   Psychiatric/Behavioral: Negative.    Maternal Medical History:  Reason for admission: Rupture of membranes and contractions.   Contractions: Onset was 3-5 hours ago.   Frequency: irregular.   Perceived severity is mild.    Fetal activity: Perceived fetal activity is normal.   Last perceived fetal movement was within the past hour.    Prenatal complications: no prenatal complications Prenatal Complications - Diabetes: none.    Dilation: 3 Effacement (%): 40 Exam by:: Charna Archer, RN Blood pressure 136/87, pulse 85, temperature 98.6 F (37 C), temperature source Oral, resp. rate 18,  height 5\' 2"  (1.575 m), weight 192 lb (87.1 kg), last menstrual period 09/13/2016, SpO2 98 %. Maternal Exam:  Uterine Assessment: Contraction strength is mild.  Contraction frequency is regular.   Abdomen: Patient reports no abdominal tenderness. Fetal presentation: vertex  Introitus: Normal vulva. Normal vagina.  Ferning test: positive.  Nitrazine test: not done. Amniotic fluid character: clear.  Pelvis: adequate for delivery.   Cervix: Cervix evaluated by digital exam.     Fetal Exam Fetal Monitor Review: Mode: ultrasound.   Baseline rate: 140's.  Variability: moderate (6-25 bpm).   Pattern: accelerations present.    Fetal State Assessment: Category I - tracings are normal.     Physical Exam  Constitutional: She is oriented to person, place, and time. She appears well-developed and well-nourished.  HENT:  Head: Normocephalic.  Eyes: Pupils are equal, round, and reactive to light.  Neck: Normal range of motion.  Cardiovascular: Normal rate, normal heart sounds and intact distal pulses.  Respiratory: Effort normal and breath sounds normal.  GI: Soft. Bowel sounds are normal.  Genitourinary: Vagina normal and uterus normal.  Musculoskeletal: Normal range of motion.  Neurological: She is alert and oriented to person, place, and time. She has normal reflexes.  Skin: Skin is warm and dry.  Psychiatric: She has a normal mood and affect. Her behavior is normal. Judgment and thought content normal.    Prenatal labs: ABO, Rh: O/Positive/-- (08/15 1700) Antibody: Negative (08/15 1700) Rubella: <0.90 (08/15 1700) RPR: Non Reactive (12/06 1022)  HBsAg: Negative (08/15 1700)  HIV: Non Reactive (12/06 1022)  GBS:     Assessment/Plan: SROM @ 08:30 clear fluid 3/th/-3 Stable maternal fetal unit Pos GBS Admit GBS prophylaxis    Wyvonnia DuskyMarie Hazell Siwik 06/13/2017, 11:25 AM

## 2017-06-13 NOTE — Progress Notes (Signed)
Bear hugger applied 

## 2017-06-13 NOTE — Op Note (Signed)
Evelyn Kennedy PROCEDURE DATE: 06/13/2017  PREOPERATIVE DIAGNOSES: Intrauterine pregnancy at 5168w3d weeks gestation; non-reassuring fetal status  POSTOPERATIVE DIAGNOSES: The same  PROCEDURE: Primary Low Transverse Cesarean Section  SURGEON, primary: Evelyn BachJohn Ferguson, MD SURGEON, fellow: Evelyn BookbinderAmber Zakari Bathe, DO  ANESTHESIOLOGY TEAM: Anesthesiologist: Evelyn Kennedy, Stephen A, MD CRNA: Evelyn Kennedy, Nataliya H, CRNA  INDICATIONS: Evelyn Kennedy is a 27 y.o. G3P1011 at 2968w3d here for cesarean section secondary to the indications listed under preoperative diagnoses; please see preoperative note for further details.  The risks of cesarean section were discussed with the patient including but were not limited to: bleeding which may require transfusion or reoperation; infection which may require antibiotics; injury to bowel, bladder, ureters or other surrounding organs; injury to the fetus; need for additional procedures including hysterectomy in the event of a life-threatening hemorrhage; placental abnormalities wth subsequent pregnancies, incisional problems, thromboembolic phenomenon and other postoperative/anesthesia complications.   The patient concurred with the proposed plan, giving informed written consent for the procedure.    FINDINGS:  Viable female infant in cephalic presentation.  Apgars 9 and 9.  Clear amniotic fluid.  Intact placenta, three vessel cord.  Normal uterus, fallopian tubes and ovaries bilaterally.  ANESTHESIA: Spinal  ESTIMATED BLOOD LOSS: 683 ml URINE OUTPUT:  300 ml SPECIMENS: Placenta sent to L&D COMPLICATIONS: None immediate  PROCEDURE IN DETAIL:  The patient preoperatively received intravenous antibiotics and had sequential compression devices applied to her lower extremities.  She was then taken to the operating room where spinal anesthesia was administered and was found to be adequate. She was then placed in a dorsal supine position with a leftward tilt, and prepped and draped in a sterile  manner.  A foley catheter was placed into her bladder and attached to constant gravity.  After an adequate timeout was performed, a Pfannenstiel skin incision was made with scalpel and carried through to the underlying layer of fascia. The fascia was incised in the midline, and this incision was extended bilaterally using blunt dissection.  Kocher clamps were applied to the superior aspect of the fascial incision and the underlying rectus muscles were dissected off bluntly.  A similar process was carried out on the inferior aspect of the fascial incision. The rectus muscles were separated in the midline bluntly and the peritoneum was entered bluntly. Attention was turned to the lower uterine segment where a low transverse hysterotomy was made with a scalpel and extended bilaterally bluntly.  The infant was successfully delivered, the cord was clamped and cut after one minute, and the infant was handed over to the awaiting neonatology team. Uterine massage was then administered, and the placenta delivered intact with a three-vessel cord. The uterus was then cleared of clots and debris.  The hysterotomy was closed with 0 Monocryl in a running locked fashion, and an imbricating layer was also placed with 0 Monocryl. The pelvis was cleared of all clot and debris. Hemostasis was confirmed on all surfaces.  The peritoneum was closed with a 0 Vicryl running stitch. The fascia was then closed using 0 Vicryl in a running fashion.  The subcutaneous layer was irrigated, then reapproximated with 2-0 plain gut interrupted stitches. The skin was closed with a 4-0 Vicryl subcuticular stitch. The patient tolerated the procedure well. Sponge, lap, instrument and needle counts were correct x 3.  She was taken to the recovery room in stable condition.    Evelyn BookbinderAmber Valeska Haislip, DO Faculty Practice, [facility] [place]

## 2017-06-14 ENCOUNTER — Encounter (HOSPITAL_COMMUNITY): Payer: Self-pay | Admitting: Obstetrics and Gynecology

## 2017-06-14 LAB — CBC
HCT: 32.3 % — ABNORMAL LOW (ref 36.0–46.0)
Hemoglobin: 10.8 g/dL — ABNORMAL LOW (ref 12.0–15.0)
MCH: 30.6 pg (ref 26.0–34.0)
MCHC: 33.4 g/dL (ref 30.0–36.0)
MCV: 91.5 fL (ref 78.0–100.0)
Platelets: 203 10*3/uL (ref 150–400)
RBC: 3.53 MIL/uL — ABNORMAL LOW (ref 3.87–5.11)
RDW: 13.7 % (ref 11.5–15.5)
WBC: 12.8 10*3/uL — ABNORMAL HIGH (ref 4.0–10.5)

## 2017-06-14 LAB — RPR: RPR Ser Ql: NONREACTIVE

## 2017-06-14 MED ORDER — MEASLES, MUMPS & RUBELLA VAC ~~LOC~~ INJ
0.5000 mL | INJECTION | Freq: Once | SUBCUTANEOUS | Status: AC
  Administered 2017-06-15: 0.5 mL via SUBCUTANEOUS
  Filled 2017-06-14: qty 0.5

## 2017-06-14 NOTE — Lactation Note (Signed)
This note was copied from a baby's chart. Lactation Consultation Note  Patient Name: Evelyn Thomasena EdisBrilynn Bayliss ZOXWR'UToday's Date: 06/14/2017 Reason for consult: Initial assessment;Term  Visited with P2 Mom with term baby at 829 hrs old.  Mom started formula supplementation in PACU due to episode of maternal hypotension and hypothermia causing dizziness and nausea.    Mom has continued to offer bottles of formula, last one 50 ml.    Baby latched to breast while Mom sitting cross legged and baby in her lap.  Offered to add pillow support to lift baby up to breast level.  Assisted Mom to use cross cradle hold, using good breast support and alternate breast compression to increase milk transfer.  Demonstrated hand expression, unable to express any colostrum at present.  Encouraged STS and feeding baby often on cue.  Mom has a hand pump to use, but explained that baby latched deeply onto breast is the best what to stimulate milk supply.  Lactation brochure given to Mom.  Mom aware of IP and OP lactation support available to her and encouraged Mom to call for concerns.  Maternal Data Formula Feeding for Exclusion: Yes Reason for exclusion: Mother's choice to formula and breast feed on admission Has patient been taught Hand Expression?: Yes Does the patient have breastfeeding experience prior to this delivery?: Yes  Feeding Feeding Type: Breast Fed  LATCH Score Latch: Grasps breast easily, tongue down, lips flanged, rhythmical sucking.  Audible Swallowing: Spontaneous and intermittent  Type of Nipple: Everted at rest and after stimulation  Comfort (Breast/Nipple): Soft / non-tender  Hold (Positioning): Assistance needed to correctly position infant at breast and maintain latch.  LATCH Score: 9  Interventions Interventions: Breast feeding basics reviewed;Assisted with latch;Skin to skin;Breast massage;Hand express;Breast compression;Adjust position;Support pillows;Position options;Hand  pump  Lactation Tools Discussed/Used WIC Program: No   Consult Status Consult Status: Follow-up Date: 06/15/17 Follow-up type: In-patient    Judee ClaraSmith, Duane Trias E 06/14/2017, 9:40 PM

## 2017-06-14 NOTE — Progress Notes (Signed)
Subjective: Postpartum Day 1: Cesarean Delivery Patient reports tolerating PO, + flatus and no problems voiding.    Objective: Vital signs in last 24 hours: Temp:  [96.2 F (35.7 C)-98.9 F (37.2 C)] 98.9 F (37.2 C) (02/25 0311) Pulse Rate:  [71-94] 71 (02/25 0311) Resp:  [11-28] 18 (02/25 0311) BP: (82-136)/(43-87) 105/51 (02/25 0311) SpO2:  [95 %-100 %] 97 % (02/25 0311) Weight:  [87.1 kg (192 lb)] 87.1 kg (192 lb) (02/24 1405)  Physical Exam:  General: alert, cooperative, appears stated age and no distress Lochia: appropriate Uterine Fundus: firm Incision: dressing CDI DVT Evaluation: No evidence of DVT seen on physical exam. No cords or calf tenderness.  Recent Labs    06/13/17 1150 06/14/17 0504  HGB 12.4 10.8*  HCT 37.1 32.3*    Assessment/Plan: Status post Cesarean section for NRFHT. Postoperative course doing well.    Continue current care.  Evelyn Kennedy 06/14/2017, 6:35 AM

## 2017-06-14 NOTE — Progress Notes (Signed)
CSW received consult due to score of 11 on the [place] Depression Screen.   MOB was pleasant, welcoming and receptive of CSW's visit.  She reports that she is feeling well.  She states no symptoms of PMADs after her first delivery 5 years ago.  She reports no hx of mental illness.  When asking about who her supports are and who she lives with, MOB states she lives with her boyfriend, 27 year old daughter and her 3 younger brothers, ages 919, 1112 and 4211.  CSW inquired about this arrangement and she explained that their mother died in a car accident 4 years ago and she assumed responsibility of her younger brothers.  She has two older siblings who live in [place], and had already moved away from home at the time of their mother's death.  MOB states she is feeling well now, but was disappointed that she needed to have a c-section and sad that her mother wasn't here for the birth of this baby.  She states her mother held her hand during the birth of her first baby.  CSW talked about the differences in the mode of delivery and how it makes them harder to compare.  While it doesn't change the fact that her mother is still not physically here with her now, she did realize that since she had a c-section, her mother wouldn't have been in the room with her anyway, because FOB was with her.  She also concludes that her mother is always with her in spirit and that she closes her eyes and pretends her mother is there.  Her eyes filled with tears and CSW gave permission for her to cry while still being happy about her new baby.  MOB smiled and agreed.  CSW encouraged her to allow herself to be emotional, while evaluating herself for negative emotions that interfere with daily life at any point in time. CSW provided education regarding Baby Blues vs PMADs and provided MOB with information about support groups [facility] at [facility].  CSW encouraged MOB to evaluate her mental health throughout the postpartum period with the use  of the New Mom Checklist developed by Postpartum Progress, as well as the [place] Postnatal Depression Screen and notify a medical professional if symptoms arise.  CSW also provided MOB with mental health resources and discussed grief counseling resources provided [facility] no charge at [facility] [place].  MOB was very Adult nurseappreciative.

## 2017-06-15 LAB — BIRTH TISSUE RECOVERY COLLECTION (PLACENTA DONATION)

## 2017-06-15 MED ORDER — NORETHIN ACE-ETH ESTRAD-FE 1-20 MG-MCG PO TABS: 1.0000 | ORAL_TABLET | Freq: Every day | ORAL | 11 refills | Status: DC

## 2017-06-15 MED ORDER — OXYCODONE-ACETAMINOPHEN 5-325 MG PO TABS: 1.0000 | ORAL_TABLET | ORAL | 0 refills | Status: DC | PRN

## 2017-06-15 NOTE — Lactation Note (Signed)
This note was copied from a baby's chart. Lactation Consultation Note  Patient Name: Girl Thomasena EdisBrilynn Kennedy ZOXWR'UToday's Date: 06/15/2017 Reason for consult: Follow-up assessment;Infant weight loss;Term(5% weight loss )  As LC entered the room , baby resting on moms chest sound asleep.  Per om attempted to feed earlier and baby only fed 1 min and fell asleep.  Baby has been breast / formula and had a large volume of formula at 0531 - 41 ml LC discussed supply and demand and the importance of giving the baby opportunity  At the breast 8-10 times a day. STS feedings , and to offer both breast prior to offering  Supplement. Also prior to every feeding - breast massage, hand express, pre-pump to  Prime milk ducts and latch with breast compressions until swallows and then intermittent.  Encouraged EBM to nipples liberally. Sore nipple and engorgement prevention and tx reviewed.  Mom already has a hand pump for D/C.  Mother informed of post-discharge support and given phone number to the lactation department, including services for phone call assistance; out-patient appointments; and breastfeeding support group. List of other breastfeeding resources in the community given in the handout. Encouraged mother to call for problems or concerns related to breastfeeding.   Maternal Data Has patient been taught Hand Expression?: Yes  Feeding Feeding Type: Breast Fed Length of feed: 1 min(per mom )  LATCH Score ( Latch score by the RN )  Latch: Grasps breast easily, tongue down, lips flanged, rhythmical sucking.  Audible Swallowing: A few with stimulation  Type of Nipple: Everted at rest and after stimulation  Comfort (Breast/Nipple): Soft / non-tender  Hold (Positioning): Assistance needed to correctly position infant at breast and maintain latch.  LATCH Score: 8  Interventions Interventions: Breast feeding basics reviewed  Lactation Tools Discussed/Used Tools: Pump Breast pump type:  Manual   Consult Status Consult Status: Complete Date: 06/15/17    Evelyn GreathouseMargaret Ann Haidan Nhan 06/15/2017, 10:09 AM

## 2017-06-15 NOTE — Discharge Summary (Addendum)
OB Discharge Summary     Patient Name: Evelyn Kennedy DOB: 05/12/1990 MRN: [id number]  Date of admission: 06/13/2017 Delivering MD: Tilda Burrow   Date of discharge: 06/15/2017  Admitting diagnosis: 39WKS WATER BROKE Intrauterine pregnancy: [redacted]w[redacted]d     Secondary diagnosis:  Active Problems:   Pregnancy   S/P C-section  Additional problems: none     Discharge diagnosis: Term Pregnancy Delivered                                                         Post partum procedures:none  Augmentation: none  Complications: None  Hospital course:  Onset of Labor With Unplanned C/S  27 y.o. yo [id number] at [redacted]w[redacted]d was admitted in Active Labor on 06/13/2017. Patient had a labor course significant for prolonged deceleration in MAU. Membrane Rupture Time/Date: 9:30 AM ,06/13/2017   The patient went for cesarean section due to Non-Reassuring FHR, and delivered a Viable infant,06/13/2017  Details of operation can be found in separate operative note. Patient had an uncomplicated postpartum course.  She is ambulating,tolerating a regular diet, passing flatus, and urinating well.  Patient is discharged home in stable condition 06/15/17.  Physical exam  Vitals:   06/14/17 1115 06/14/17 1609 06/14/17 1858 06/15/17 0534  BP: (!) 117/56 109/63 107/76 123/70  Pulse: 66 76 62 (!) 59  Resp: 20 20 18 18   Temp: 97.8 F (36.6 C) 98.5 F (36.9 C) 98.3 F (36.8 C) 98.3 F (36.8 C)  TempSrc: Oral Oral Oral Oral  SpO2: 100% 100% 100% 99%  Weight:      Height:       General: alert, cooperative and no distress Lochia: appropriate Uterine Fundus: firm Incision: Dressing is clean, dry, and intact DVT Evaluation: No evidence of DVT seen on physical exam. No cords or calf tenderness. Labs: Lab Results  Component Value Date   WBC 12.8 (H) 06/14/2017   HGB 10.8 (L) 06/14/2017   HCT 32.3 (L) 06/14/2017   MCV 91.5 06/14/2017   PLT 203 06/14/2017   CMP Latest Ref Rng & Units 06/13/2017  Glucose 65 - 99  mg/dL -  BUN 6 - 20 mg/dL -  Creatinine 6.64 - 4.03 mg/dL 4.74  Sodium 259 - 563 mmol/L -  Potassium 3.5 - 5.2 mmol/L -  Chloride 96 - 106 mmol/L -  CO2 20 - 29 mmol/L -  Calcium 8.7 - 10.2 mg/dL -  Total Protein 6.0 - 8.5 g/dL -  Total Bilirubin 0.0 - 1.2 mg/dL -  Alkaline Phos 39 - 875 IU/L -  AST 0 - 40 IU/L -  ALT 0 - 32 IU/L -    Discharge instruction: per After Visit Summary and "Baby and Me Booklet".  After visit meds:  Allergies as of 06/15/2017   No Known Allergies     Medication List    TAKE these medications   oxyCODONE-acetaminophen 5-325 MG tablet Commonly known as:  PERCOCET/ROXICET Take 1 tablet by mouth every 4 (four) hours as needed for severe pain.   PRENATE PIXIE 10-0.6-0.4-200 MG Caps Take 1 tablet by mouth daily.       Diet: routine diet  Activity: Advance as tolerated. Pelvic rest for 6 weeks.   Outpatient follow up: 2-week incision check Follow up Appt: Future Appointments  Date Time Provider Department Center  06/28/2017  1:00 PM Orvilla Cornwallenney, Rachelle A, CNM [facility] None  07/12/2017 10:00 AM Denney, Rachelle A, CNM [facility] None   Follow up Visit:No Follow-up on file.  Postpartum contraception: Combination OCPs  Newborn Data: Live born female  Birth Weight: 7 lb 9.7 oz (3450 g) APGAR: 9, 9  Newborn Delivery   Birth date/time:  06/13/2017 16:04:00 Delivery type:  C-Section, Low Transverse C-section categorization:  Primary     Baby Feeding: Bottle and Breast Disposition:home with mother   06/15/2017 Loni MuseKate Timberlake, MD  OB FELLOW DISCHARGE ATTESTATION  I have seen and examined this patient and agree with above documentation in the resident's note.   Frederik PearJulie P Johnika Escareno, MD OB Fellow 7:34 AM

## 2017-06-15 NOTE — Discharge Instructions (Signed)

## 2017-06-17 ENCOUNTER — Encounter: Payer: PRIVATE HEALTH INSURANCE | Admitting: Certified Nurse Midwife

## 2017-06-23 DIAGNOSIS — Z3483 Encounter for supervision of other normal pregnancy, third trimester: Secondary | ICD-10-CM

## 2017-06-24 ENCOUNTER — Telehealth: Payer: Self-pay

## 2017-06-24 ENCOUNTER — Inpatient Hospital Stay (HOSPITAL_COMMUNITY): Payer: PRIVATE HEALTH INSURANCE
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
## 2017-06-28 ENCOUNTER — Encounter: Payer: Self-pay | Admitting: Certified Nurse Midwife

## 2017-06-28 ENCOUNTER — Ambulatory Visit (INDEPENDENT_AMBULATORY_CARE_PROVIDER_SITE_OTHER): Payer: PRIVATE HEALTH INSURANCE | Admitting: Certified Nurse Midwife

## 2017-06-28 VITALS — BP 121/80 | HR 71 | Wt 175.4 lb

## 2017-06-28 DIAGNOSIS — Z30011 Encounter for initial prescription of contraceptive pills: Secondary | ICD-10-CM

## 2017-06-28 DIAGNOSIS — Z3202 Encounter for pregnancy test, result negative: Secondary | ICD-10-CM

## 2017-06-28 DIAGNOSIS — Z1389 Encounter for screening for other disorder: Secondary | ICD-10-CM

## 2017-06-28 DIAGNOSIS — Z98891 History of uterine scar from previous surgery: Secondary | ICD-10-CM

## 2017-06-28 LAB — POCT URINE PREGNANCY: Preg Test, Ur: NEGATIVE

## 2017-06-28 MED ORDER — NORETHINDRONE 0.35 MG PO TABS: 1.0000 | ORAL_TABLET | Freq: Every day | ORAL | 11 refills | Status: DC

## 2017-06-28 NOTE — Addendum Note (Signed)
Addended by: Natale MilchSTALLING, Adam Demary D on: 06/28/2017 02:34 PM   Modules accepted: Orders

## 2017-06-28 NOTE — Progress Notes (Signed)
..  Post Partum Exam  Evelyn Kennedy is a 27 y.o. 703P2012 female who presents for a postpartum visit. She is 2 weeks postpartum following a low cervical transverse Cesarean section. I have fully reviewed the prenatal and intrapartum course. The delivery was at 39.3 gestational weeks.  Anesthesia: spinal. Postpartum course has been good. Baby's course has been good. Baby is feeding by breast. Bleeding staining only. Bowel function is normal. Bladder function is normal. Patient is not sexually active. Contraception method is none. Postpartum depression screening:neg.  Here for incision exam. S/P repeat LTCS for fetal intolerance to labor, had umbilical cord in her hand. C/S on 06/13/17.  Is currently pumping breast milk every 4 hours.      The following portions of the patient's history were reviewed and updated as appropriate: allergies, current medications, past family history, past medical history, past social history, past surgical history and problem list. Last pap smear done 03/11/16 and was Normal  Review of Systems Pertinent items noted in HPI and remainder of comprehensive ROS otherwise negative.    Objective:  unknown if currently breastfeeding.  General:  alert, cooperative and no distress   Breasts:  inspection negative, no nipple discharge or bleeding, no masses or nodularity palpable  Lungs: clear to auscultation bilaterally  Heart:  regular rate and rhythm, S1, S2 normal, no murmur, click, rub or gallop  Abdomen: soft, non-tender; bowel sounds normal; no masses,  no organomegaly, C/S wound: edges well approximated, no s/s infection noted, C/D/I, non tender to palpation, steri strips removed.   Pelvic/Rectal Exam: Not performed.        Assessment:    Normal 2 week postpartum exam.  Pap smear not done at today's visit.   S/P RLTCS for fetal intolerance to labor   Plan:   1. Contraception: oral progesterone-only contraceptive 2. POP sent to the pharmacy with instructions.   Lactation consult if breast feeding is not improving.  3. Follow up in: 4 weeks for PP exam with pap smear or as needed.

## 2017-07-12 ENCOUNTER — Ambulatory Visit: Payer: PRIVATE HEALTH INSURANCE | Admitting: Certified Nurse Midwife

## 2017-07-26 ENCOUNTER — Encounter: Payer: Self-pay | Admitting: Certified Nurse Midwife

## 2017-07-26 ENCOUNTER — Other Ambulatory Visit (HOSPITAL_COMMUNITY)
Admission: RE | Admit: 2017-07-26 | Discharge: 2017-07-26 | Disposition: A | Discharge status: Home / Self Care | Payer: Medicaid Other | Source: Ambulatory Visit | Attending: Certified Nurse Midwife | Admitting: Certified Nurse Midwife

## 2017-07-26 ENCOUNTER — Ambulatory Visit (INDEPENDENT_AMBULATORY_CARE_PROVIDER_SITE_OTHER): Payer: Medicaid Other | Admitting: Certified Nurse Midwife

## 2017-07-26 ENCOUNTER — Ambulatory Visit: Payer: Medicaid Other | Admitting: Certified Nurse Midwife

## 2017-07-26 NOTE — Progress Notes (Signed)
Post Partum Exam  Evelyn Kennedy is a 27 y.o. 243P2012 female who presents for a postpartum visit. She is 6 weeks postpartum following a low cervical transverse Cesarean section. I have fully reviewed the prenatal and intrapartum course. The delivery was at 39 gestational weeks.  Anesthesia: spinal. Postpartum course has been doing well. Baby's course has been doing well. Baby is feeding by breast. Pt is pumping and feeding by bottle- breast milk. Bleeding no bleeding. Bowel function is normal. Bladder function is normal. Patient is sexually active. Contraception method is none.  Postpartum depression screening:neg  The following portions of the patient's history were reviewed and updated as appropriate: allergies, current medications, past family history, past medical history, past social history, past surgical history and problem list. Last pap smear done 02/2016 and was Normal  Review of Systems Pertinent items noted in HPI and remainder of comprehensive ROS otherwise negative.    Objective:  currently breastfeeding.  General:  alert, cooperative and no distress   Breasts:  inspection negative, no nipple discharge or bleeding, no masses or nodularity palpable  Lungs: clear to auscultation bilaterally  Heart:  regular rate and rhythm, S1, S2 normal, no murmur, click, rub or gallop  Abdomen: soft, non-tender; bowel sounds normal; no masses,  no organomegaly   Vulva:  normal  Vagina: normal vagina, no discharge, exudate, lesion, or erythema  Cervix:  no bleeding following Pap  Corpus: normal size, contour, position, consistency, mobility, non-tender  Adnexa:  normal adnexa  Rectal Exam: Not performed.        Assessment:    Normal 6 week postpartum exam. Pap smear done at today's visit.   Plan:   1. Contraception: abstinence and oral progesterone-only contraceptive 2. Start on POP today.  Has medication at home.  Is planning to return to work in a few weeks. Is currently pumping and  doing well. 3. Follow up in: 1 year or when stops breast feeding or as needed.

## 2017-07-28 ENCOUNTER — Other Ambulatory Visit: Payer: Self-pay | Admitting: Certified Nurse Midwife

## 2017-07-28 LAB — CYTOLOGY - PAP: Diagnosis: NEGATIVE

## 2018-02-18 ENCOUNTER — Ambulatory Visit (HOSPITAL_COMMUNITY)
Admission: EM | Admit: 2018-02-18 | Discharge: 2018-02-18 | Disposition: A | Discharge status: Home / Self Care | Payer: PRIVATE HEALTH INSURANCE | Attending: Emergency Medicine | Admitting: Emergency Medicine

## 2018-02-18 ENCOUNTER — Encounter (HOSPITAL_COMMUNITY): Payer: Self-pay | Admitting: Emergency Medicine

## 2018-02-18 DIAGNOSIS — Z207 Contact with and (suspected) exposure to pediculosis, acariasis and other infestations: Secondary | ICD-10-CM

## 2018-02-18 DIAGNOSIS — Z2089 Contact with and (suspected) exposure to other communicable diseases: Secondary | ICD-10-CM

## 2018-02-18 DIAGNOSIS — R21 Rash and other nonspecific skin eruption: Secondary | ICD-10-CM

## 2018-02-18 DIAGNOSIS — Z30011 Encounter for initial prescription of contraceptive pills: Secondary | ICD-10-CM

## 2018-02-18 MED ORDER — PERMETHRIN 5 % EX CREA: TOPICAL_CREAM | CUTANEOUS | 0 refills | Status: DC

## 2018-02-18 NOTE — ED Triage Notes (Signed)
Pt states her daughter was recently dx with scabies, pt states shes been itching for the last month.

## 2018-02-18 NOTE — Discharge Instructions (Addendum)
Topical: Cream 5%: Thoroughly massage cream (30 g for average adult) from head to soles of feet; leave on for 8 to 14 hours before removing (shower or bath); for infants and the elderly, also apply on the hairline, neck, scalp, temple, and forehead; may repeat if living mites are observed 14 days after first treatment; one application is generally curative.  May use zyrtec during the day for itching and add in benadryl at night time

## 2018-02-18 NOTE — ED Provider Notes (Signed)
[facility]    CSN: [id number] Arrival date & time: 02/18/18  1058     History   Chief Complaint Chief Complaint  Patient presents with  . Appointment    11  . Possible Scabies    HPI Evelyn Kennedy is a 27 y.o. female presenting today for evaluation and concern for scabies.  Patient states that her daughter was diagnosed with scabies yesterday.  She had had an itchy rash to her feet and chest for the past month.  Patient has noticed recently she has developed a rash to her upper extremities over the past couple of days that has been significantly itchy.  Other close contacts at home with similar symptoms.  Patient denies any other new contacts.  Would like to be treated with her children at the same time.  She has not used any medicines for her symptoms yet.  HPI  History reviewed. No pertinent past medical history.  Patient Active Problem List   Diagnosis Date Noted  . Pregnancy 06/13/2017  . S/P C-section 06/13/2017  . Positive GBS test 05/17/2017  . Rubella non-immune status, antepartum 12/07/2016  . Encounter for supervision of normal pregnancy, unspecified, unspecified trimester 12/02/2016  . PCOS (polycystic ovarian syndrome) 08/15/2014    Past Surgical History:  Procedure Laterality Date  . CESAREAN SECTION N/A 06/13/2017   Procedure: CESAREAN SECTION;  Surgeon: Tilda Burrow, MD;  Location: [facility] BIR[facility];  Service: Obstetrics;  Laterality: N/A;  . TONSILLECTOMY      OB History    Gravida  3   Para  2   Term  2   Preterm      AB  1   Living  2     SAB      TAB  1   Ectopic      Multiple  0   Live Births  2            Home Medications    Prior to Admission medications   Medication Sig Start Date End Date Taking? Authorizing Provider  norethindrone (MICRONOR,CAMILA,ERRIN) 0.35 MG tablet Take 1 tablet (0.35 mg total) by mouth daily. Patient not taking: Reported on 07/26/2017 06/28/17   Roe Coombs, CNM    permethrin (ELIMITE) 5 % cream Apply to affected area once 02/18/18   , Junius Creamer, PA-C    Family History Family History  Problem Relation Age of Onset  . Hypertension Father   . Diabetes Maternal Grandfather   . Stroke Maternal Grandfather   . Heart attack Paternal Grandfather   . Alcohol abuse Neg Hx   . Arthritis Neg Hx   . Asthma Neg Hx   . Birth defects Neg Hx   . Cancer Neg Hx   . COPD Neg Hx   . Depression Neg Hx   . Drug abuse Neg Hx   . Early death Neg Hx   . Hearing loss Neg Hx   . Heart disease Neg Hx   . Hyperlipidemia Neg Hx   . Kidney disease Neg Hx   . Learning disabilities Neg Hx   . Mental illness Neg Hx   . Mental retardation Neg Hx     Social History Social History   Tobacco Use  . Smoking status: Never Smoker  . Smokeless tobacco: Never Used  Substance Use Topics  . Alcohol use: No    Alcohol/week: 0.0 standard drinks  . Drug use: No     Allergies   Patient has no known  allergies.   Review of Systems Review of Systems  Constitutional: Negative for fatigue and fever.  HENT: Negative for mouth sores.   Eyes: Negative for visual disturbance.  Respiratory: Negative for shortness of breath.   Cardiovascular: Negative for chest pain.  Gastrointestinal: Negative for abdominal pain, nausea and vomiting.  Musculoskeletal: Negative for arthralgias and joint swelling.  Skin: Positive for color change and rash. Negative for wound.  Neurological: Negative for dizziness, weakness, light-headedness and headaches.     Physical Exam Triage Vital Signs ED Triage Vitals [02/18/18 1128]  Enc Vitals Group     BP 113/76     Pulse Rate 85     Resp 18     Temp 98.3 F (36.8 C)     Temp src      SpO2 100 %     Weight      Height      Head Circumference      Peak Flow      Pain Score 0     Pain Loc      Pain Edu?      Excl. in GC?    No data found.  Updated Vital Signs BP 113/76   Pulse 85   Temp 98.3 F (36.8 C)   Resp 18   LMP  02/14/2018   SpO2 100%   Visual Acuity Right Eye Distance:   Left Eye Distance:   Bilateral Distance:    Right Eye Near:   Left Eye Near:    Bilateral Near:     Physical Exam  Constitutional: She appears well-developed and well-nourished. No distress.  HENT:  Head: Normocephalic and atraumatic.  No lesions on oral mucosa  Eyes: Conjunctivae are normal.  Neck: Neck supple.  Cardiovascular: Normal rate and regular rhythm.  No murmur heard. Pulmonary/Chest: Effort normal and breath sounds normal. No respiratory distress.  Breathing comfortably at rest, CTABL, no wheezing, rales or other adventitious sounds auscultated  Abdominal: Soft. There is no tenderness.  Musculoskeletal: She exhibits no edema.  Neurological: She is alert.  Skin: Skin is warm and dry. Rash noted.  Erythematous slightly raised papular lesions to bilateral upper extremities, no obvious webbing or tunneling observed.  Psychiatric: She has a normal mood and affect.  Nursing note and vitals reviewed.    UC Treatments / Results  Labs (all labs ordered are listed, but only abnormal results are displayed) Labs Reviewed - No data to display  EKG None  Radiology No results found.  Procedures Procedures (including critical care time)  Medications Ordered in UC Medications - No data to display  Initial Impression / Assessment and Plan / UC Course  I have reviewed the triage vital signs and the nursing notes.  Pertinent labs & imaging results that were available during my care of the patient were reviewed by me and considered in my medical decision making (see chart for details).     Patient with nonspecific rash upper extremities.  Possible scabies given recent exposure versus other contact dermatitis.  At this time will treat for scabies with permethrin and antihistamines.  Continue to monitor rash and symptoms.  Discussed further measurements for scabies including cleaning linens with hot water.   Information provided about scabies.  Discussed strict return precautions. Patient verbalized understanding and is agreeable with plan.  Final Clinical Impressions(s) / UC Diagnoses   Final diagnoses:  Rash and nonspecific skin eruption  Scabies exposure     Discharge Instructions     Topical: Cream 5%:  Thoroughly massage cream (30 g for average adult) from head to soles of feet; leave on for 8 to 14 hours before removing (shower or bath); for infants and the elderly, also apply on the hairline, neck, scalp, temple, and forehead; may repeat if living mites are observed 14 days after first treatment; one application is generally curative.  May use zyrtec during the day for itching and add in benadryl at night time   ED Prescriptions    Medication Sig Dispense Auth. Provider   permethrin (ELIMITE) 5 % cream Apply to affected area once 60 g , [place] C, PA-C     Controlled Substance Prescriptions [place] Controlled Substance Registry consulted? Not Applicable   Lew Dawes, [place] 02/18/18 1215

## 2018-07-20 ENCOUNTER — Other Ambulatory Visit: Payer: Self-pay

## 2018-07-20 DIAGNOSIS — Z30011 Encounter for initial prescription of contraceptive pills: Secondary | ICD-10-CM

## 2018-07-20 MED ORDER — NORETHINDRONE 0.35 MG PO TABS: 1.0000 | ORAL_TABLET | Freq: Every day | ORAL | 11 refills | Status: DC

## 2018-07-20 NOTE — Progress Notes (Signed)
Refill pts BC pills, unable to get appt for annual because of Corona Virus

## 2018-11-24 ENCOUNTER — Other Ambulatory Visit (HOSPITAL_COMMUNITY)
Admission: RE | Admit: 2018-11-24 | Discharge: 2018-11-24 | Disposition: A | Discharge status: Home / Self Care | Payer: Medicaid Other | Source: Ambulatory Visit | Attending: Obstetrics | Admitting: Obstetrics

## 2018-11-24 ENCOUNTER — Ambulatory Visit (INDEPENDENT_AMBULATORY_CARE_PROVIDER_SITE_OTHER): Payer: Self-pay

## 2018-11-24 ENCOUNTER — Other Ambulatory Visit: Payer: Self-pay

## 2018-11-24 DIAGNOSIS — N898 Other specified noninflammatory disorders of vagina: Secondary | ICD-10-CM | POA: Insufficient documentation

## 2018-11-24 DIAGNOSIS — Z113 Encounter for screening for infections with a predominantly sexual mode of transmission: Secondary | ICD-10-CM | POA: Diagnosis present

## 2018-11-24 MED ORDER — FLUCONAZOLE 150 MG PO TABS: 150.0000 mg | ORAL_TABLET | Freq: Once | ORAL | 0 refills | Status: AC

## 2018-11-24 NOTE — Progress Notes (Signed)
SUBJECTIVE:  28 y.o. female complains of white vaginal discharge with itching. Denies abnormal vaginal bleeding or significant pelvic pain or fever. No UTI symptoms. Pt requests STD blood work.  OBJECTIVE:  She appears well, afebrile. Urine dipstick: not done.  ASSESSMENT:  Vaginal Discharge    PLAN:  STD blood work completed. Her insurance covers only GC, chlamydia, probe sent to lab. Treatment: To be determined once lab results are received ROV prn if symptoms persist or worsen. Diflucan sent to the pharmacy per protocol

## 2018-11-25 LAB — RPR: RPR Ser Ql: NONREACTIVE

## 2018-11-25 LAB — HEPATITIS B SURFACE ANTIGEN: Hepatitis B Surface Ag: NEGATIVE

## 2018-11-25 LAB — HIV ANTIBODY (ROUTINE TESTING W REFLEX): HIV Screen 4th Generation wRfx: NONREACTIVE

## 2018-11-25 LAB — HEPATITIS C ANTIBODY: Hep C Virus Ab: <0.1 s/co ratio (ref 0.0–0.9)

## 2018-11-26 LAB — CERVICOVAGINAL ANCILLARY ONLY
Chlamydia: NEGATIVE
Neisseria Gonorrhea: NEGATIVE

## 2018-12-15 ENCOUNTER — Ambulatory Visit: Payer: Medicaid Other | Admitting: Obstetrics and Gynecology

## 2019-03-19 IMAGING — US US MFM OB COMP +14 WKS
1 series · 14 of 28 positions shown · non-contrast
Comparison: none

[Series 1: us mfm ob comp +14 wks · 81 acquisitions, 14 frames shown]
[im 3/81]
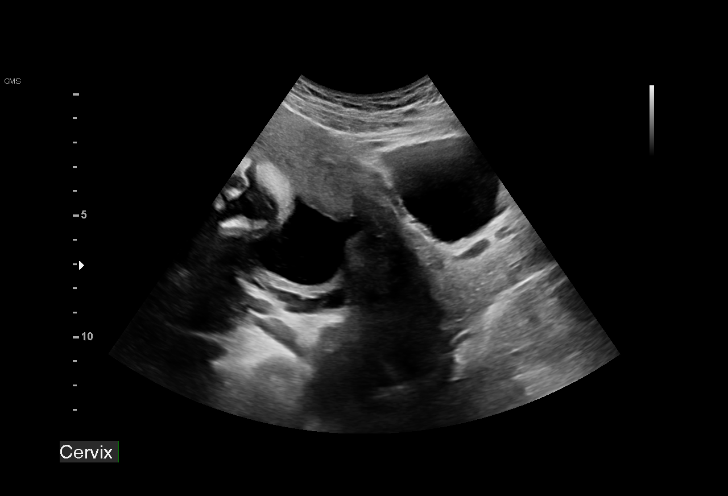
[im 9/81]
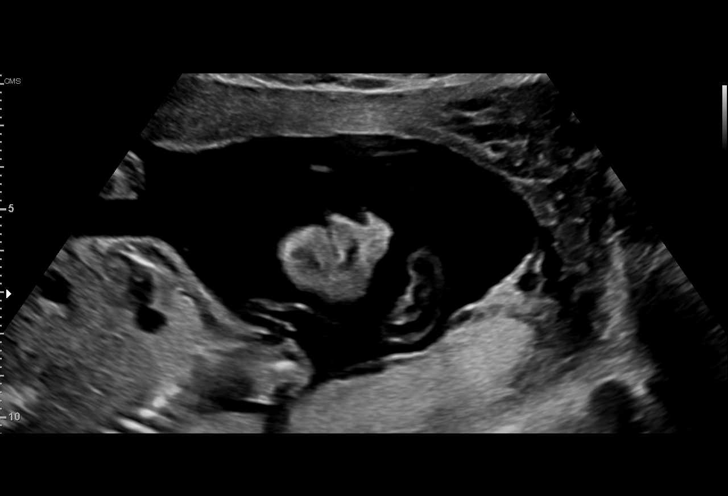
[im 15/81]
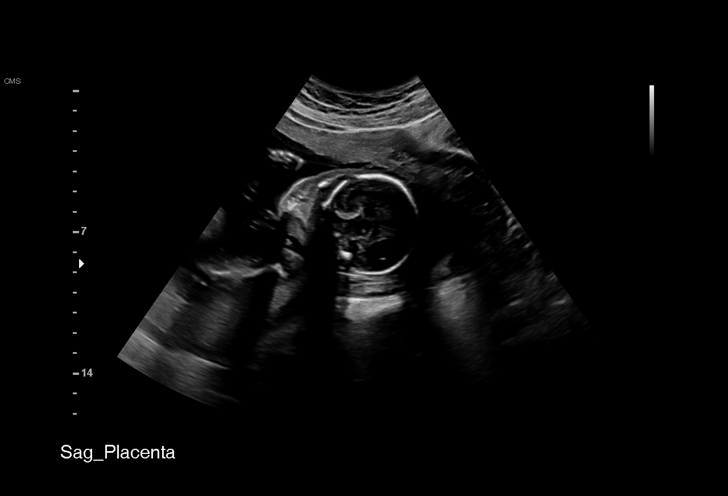
[im 21/81]
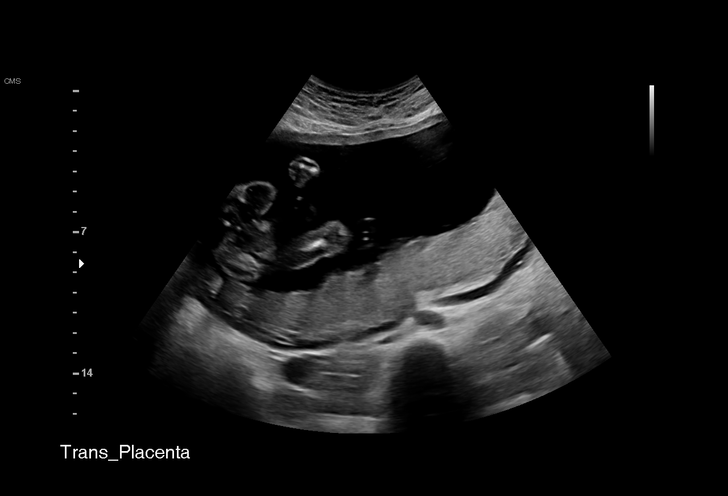
[im 27/81]
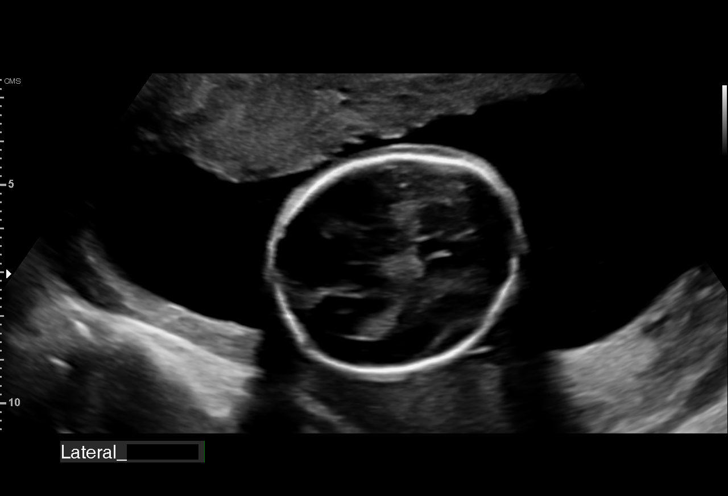
[im 33/81]
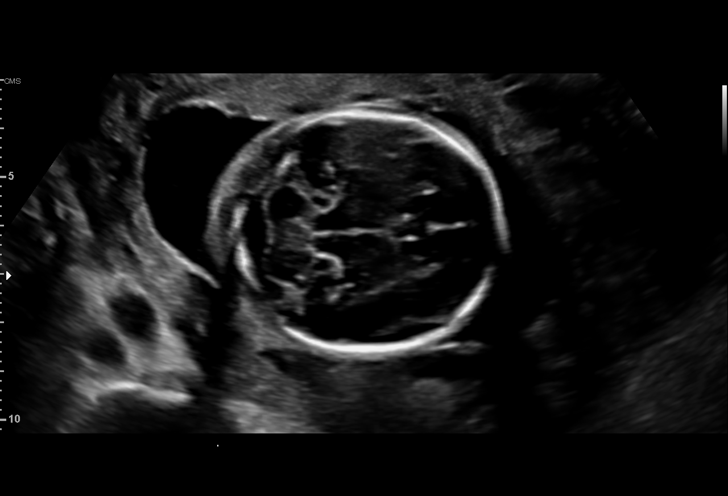
[im 39/81]
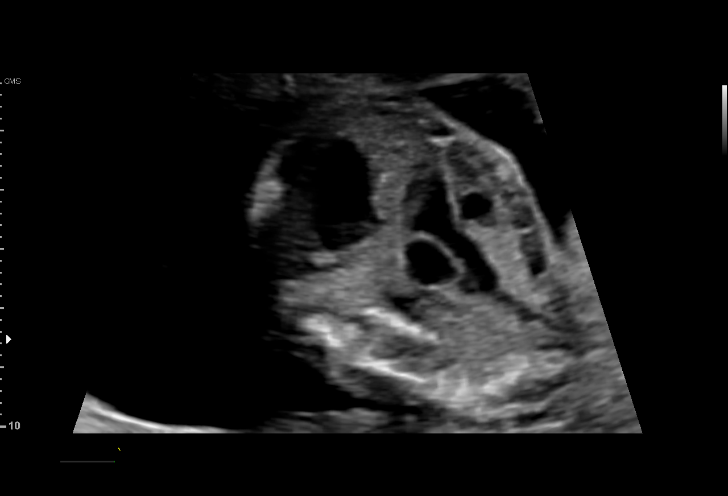
[im 45/81]
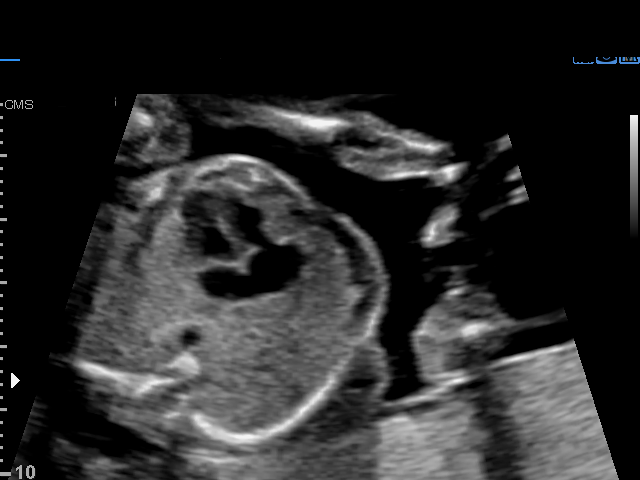
[im 51/81]
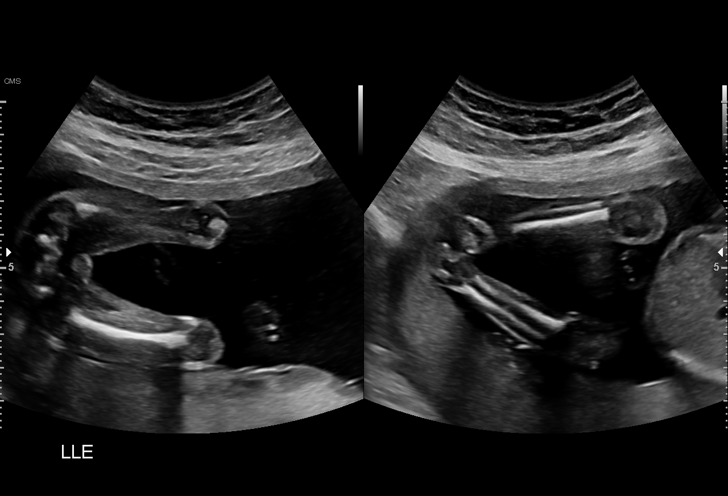
[im 57/81]
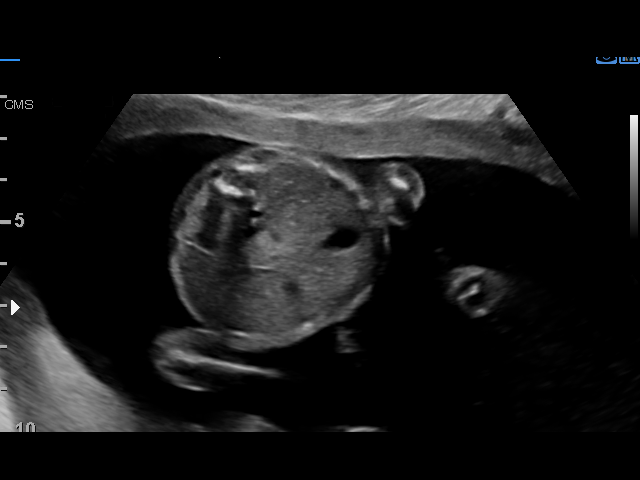
[im 63/81]
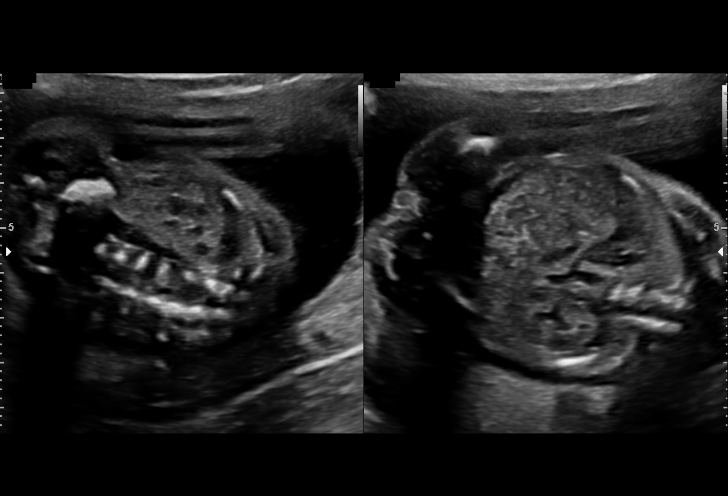
[im 69/81]
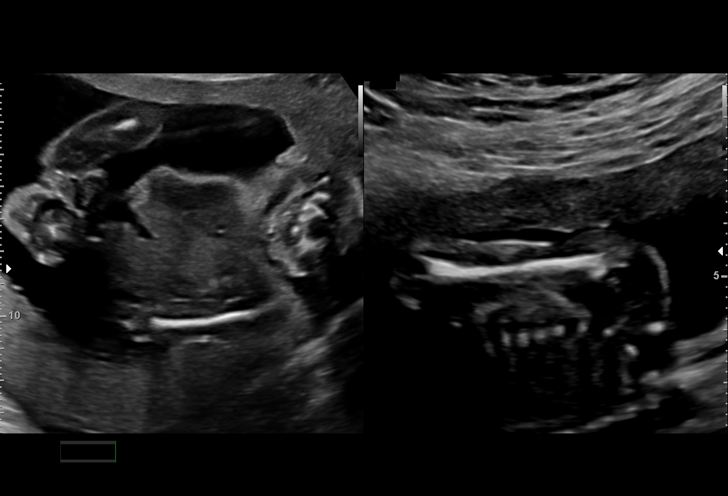
[im 75/81]
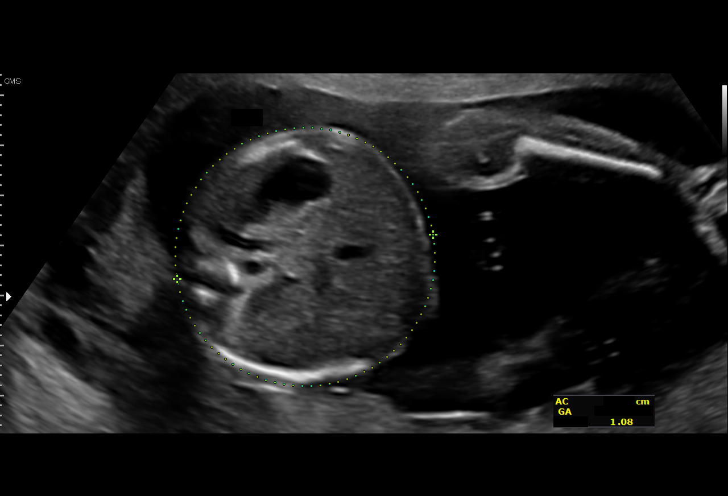
[im 81/81]
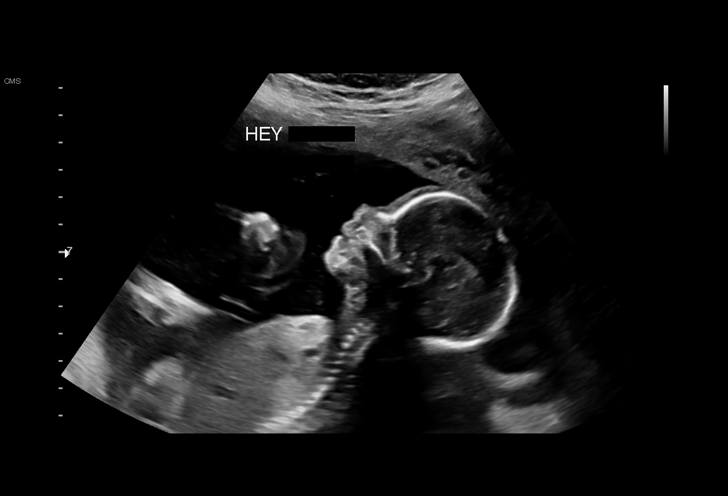

[14 of 28 positions shown; findings below may reference images not displayed]

[REDACTED]care -
[HOSPITAL]([HOSPITAL]
)

Indications

20 weeks gestation of pregnancy
Encounter for fetal anatomic survey
Encounter for uncertain dates
OB History

Gravidity:    3         Term:   1        Prem:   0        SAB:   1
TOP:          0       Ectopic:  0        Living: 1
Fetal Evaluation

Num Of Fetuses:     1
Fetal Heart         165
Rate(bpm):
Cardiac Activity:   Observed
Presentation:       Cephalic
Placenta:           Posterior, above cervical os
P. Cord Insertion:  Visualized

Amniotic Fluid
AFI FV:      Subjectively within normal limits

Largest Pocket(cm)
6.84
Biometry
BPD:      48.5  mm     G. Age:  20w 5d         47  %    CI:        76.41   %    70 - 86
FL/HC:      19.1   %    15.9 -
HC:      175.8  mm     G. Age:  20w 1d         17  %    HC/AC:      1.08        1.06 -
AC:      162.7  mm     G. Age:  21w 3d         64  %    FL/BPD:     69.3   %
FL:       33.6  mm     G. Age:  20w 4d         35  %    FL/AC:      20.7   %    20 - 24
HUM:        34  mm     G. Age:  21w 4d         71  %
CER:      22.1  mm     G. Age:  21w 0d         56  %

CM:          5  mm

Est. FW:     383  gm    0 lb 14 oz      49  %
Gestational Age

LMP:           20w 2d        Date:  09/13/16                 EDD:   06/20/17
U/S Today:     20w 5d                                        EDD:   06/17/17
Best:          20w 5d     Det. By:  U/S (02/02/17)           EDD:   06/17/17
Anatomy

Cranium:               Appears normal         Aortic Arch:            Appears normal
Cavum:                 Appears normal         Ductal Arch:            Appears normal
Ventricles:            Appears normal         Diaphragm:              Appears normal
Choroid Plexus:        Appears normal         Stomach:                Appears normal, left
sided
Cerebellum:            Appears normal         Abdomen:                Appears normal
Posterior Fossa:       Appears normal         Abdominal Wall:         Appears nml (cord
insert, abd wall)
Nuchal Fold:           Not applicable (>20    Cord Vessels:           Appears normal (3
wks GA)                                        vessel cord)
Face:                  Appears normal         Kidneys:                Appear normal
(orbits and profile)
Lips:                  Appears normal         Bladder:                Appears normal
Thoracic:              Appears normal         Spine:                  Appears normal
Heart:                 Appears normal         Upper Extremities:      Appears normal
(4CH, axis, and
situs)
RVOT:                  Appears normal         Lower Extremities:      Appears normal
LVOT:                  Appears normal

Other:  Female gender. Heels and 5th digit visualized. Nasal bone visualized.
Cervix Uterus Adnexa

Cervix
Length:           4.63  cm.
Normal appearance by transabdominal scan.

Uterus
No abnormality visualized.

Left Ovary
Within normal limits.

Right Ovary
Within normal limits.

Cul De Sac:   No free fluid seen.

Adnexa:       No abnormality visualized.
Impression

Singleton intrauterine pregnancy at 20 weeks 5 days
gestation with fetal cardiac activity
Cephalic presentation
Posterior placenta without evidence of previa
Normal appearing fetal growth and amniotic fluid volume
No apparent birth defects noted on fetal anatomic survey
Normal appearing cervical length
Recommendations

Follow-up ultrasounds as clinically indicated.

## 2019-05-16 ENCOUNTER — Encounter: Payer: Self-pay | Admitting: Family Medicine

## 2019-05-16 ENCOUNTER — Ambulatory Visit (INDEPENDENT_AMBULATORY_CARE_PROVIDER_SITE_OTHER): Payer: BC Managed Care – PPO | Admitting: Family Medicine

## 2019-05-16 ENCOUNTER — Other Ambulatory Visit (HOSPITAL_COMMUNITY)
Admission: RE | Admit: 2019-05-16 | Discharge: 2019-05-16 | Disposition: A | Discharge status: Home / Self Care | Payer: BC Managed Care – PPO | Source: Ambulatory Visit | Attending: Obstetrics and Gynecology | Admitting: Obstetrics and Gynecology

## 2019-05-16 VITALS — BP 114/76 | HR 70 | Ht 62.0 in | Wt 175.0 lb

## 2019-05-16 DIAGNOSIS — Z119 Encounter for screening for infectious and parasitic diseases, unspecified: Secondary | ICD-10-CM

## 2019-05-16 DIAGNOSIS — Z01419 Encounter for gynecological examination (general) (routine) without abnormal findings: Secondary | ICD-10-CM | POA: Insufficient documentation

## 2019-05-16 DIAGNOSIS — Z3041 Encounter for surveillance of contraceptive pills: Secondary | ICD-10-CM

## 2019-05-16 MED ORDER — NORETHINDRONE 0.35 MG PO TABS: 1.0000 | ORAL_TABLET | Freq: Every day | ORAL | 11 refills | Status: DC

## 2019-05-16 NOTE — Progress Notes (Signed)
GYNECOLOGY ANNUAL PREVENTATIVE CARE ENCOUNTER NOTE  Subjective:   Evelyn Kennedy is a 29 y.o. [phone] female here for a annual gynecologic exam. Current complaints: none.   Denies abnormal vaginal bleeding, discharge, pelvic pain, problems with intercourse or other gynecologic concerns.    Gynecologic History Patient's last menstrual period was 05/02/2019 (approximate). Contraception: oral progesterone-only contraceptive Last Pap: 2019. Results were: normal Last mammogram: n/a.  Gardisil: has not received  Obstetric History OB History  Gravida Para Term Preterm AB Living  3 2 2   1 2   SAB TAB Ectopic Multiple Live Births    1   0 2    # Outcome Date GA Lbr Len/2nd Weight Sex Delivery Anes PTL Lv  3 Term 06/13/17 [redacted]w[redacted]d  7 lb 9.7 oz (3.45 kg) F CS-LTranv Spinal  LIV  2 TAB 05/04/14          1 Term 05/15/12 [redacted]w[redacted]d -11:49 / 00:40 7 lb 0.3 oz (3.184 kg) F Vag-Spont EPI  LIV    History reviewed. No pertinent past medical history.  Past Surgical History:  Procedure Laterality Date  . CESAREAN SECTION N/A 06/13/2017   Procedure: CESAREAN SECTION;  Surgeon: 06/15/2017, MD;  Location: [facility] BIRTHING SUIT[facility];  Service: Obstetrics;  Laterality: N/A;  . TONSILLECTOMY      Current Outpatient Medications on File Prior to Visit  Medication Sig Dispense Refill  . permethrin (ELIMITE) 5 % cream Apply to affected area once (Patient not taking: Reported on 05/16/2019) 60 g 0   No current facility-administered medications on file prior to visit.    No Known Allergies  Social History   Socioeconomic History  . Marital status: Single    Spouse name: Not on file  . Number of children: Not on file  . Years of education: Not on file  . Highest education level: Not on file  Occupational History  . Not on file  Tobacco Use  . Smoking status: Never Smoker  . Smokeless tobacco: Never Used  Substance and Sexual Activity  . Alcohol use: No    Alcohol/week: 0.0 standard drinks  . Drug  use: No  . Sexual activity: Yes    Partners: Male    Birth control/protection: Pill  Other Topics Concern  . Not on file  Social History Narrative  . Not on file   Social Determinants of Health   Financial Resource Strain:   . Difficulty of Paying Living Expenses: Not on file  Food Insecurity:   . Worried About 05/18/2019 in the Last Year: Not on file  . Ran Out of Food in the Last Year: Not on file  Transportation Needs:   . Lack of Transportation (Medical): Not on file  . Lack of Transportation (Non-Medical): Not on file  Physical Activity:   . Days of Exercise per Week: Not on file  . Minutes of Exercise per Session: Not on file  Stress:   . Feeling of Stress : Not on file  Social Connections:   . Frequency of Communication with Friends and Family: Not on file  . Frequency of Social Gatherings with Friends and Family: Not on file  . Attends Religious Services: Not on file  . Active Member of Clubs or Organizations: Not on file  . Attends Programme researcher, broadcasting/film/video Meetings: Not on file  . Marital Status: Not on file  Intimate Partner Violence:   . Fear of Current or Ex-Partner: Not on file  . Emotionally Abused: Not on  file  . Physically Abused: Not on file  . Sexually Abused: Not on file    Family History  Problem Relation Age of Onset  . Hypertension Father   . Diabetes Maternal Grandfather   . Stroke Maternal Grandfather   . Heart attack Paternal Grandfather   . Breast cancer Cousin   . Alcohol abuse Neg Hx   . Arthritis Neg Hx   . Asthma Neg Hx   . Birth defects Neg Hx   . Cancer Neg Hx   . COPD Neg Hx   . Depression Neg Hx   . Drug abuse Neg Hx   . Early death Neg Hx   . Hearing loss Neg Hx   . Heart disease Neg Hx   . Hyperlipidemia Neg Hx   . Kidney disease Neg Hx   . Learning disabilities Neg Hx   . Mental illness Neg Hx   . Mental retardation Neg Hx     Diet: varied Exercise: reports  The following portions of the patient's history were  reviewed and updated as appropriate: allergies, current medications, past family history, past medical history, past social history, past surgical history and problem list.  Review of Systems Pertinent items are noted in HPI.   Objective:  BP 114/76   Pulse 70   Ht 5\' 2"  (1.575 m)   Wt 175 lb (79.4 kg)   LMP 05/02/2019 (Approximate)   BMI 32.01 kg/m  CONSTITUTIONAL: Well-developed, well-nourished female in no acute distress.  HENT:  Normocephalic, atraumatic, External right and left ear normal. Oropharynx is clear and moist EYES: Conjunctivae and EOM are normal. Pupils are equal, round, and reactive to light. No scleral icterus.  NECK: Normal range of motion, supple, no masses.  Normal thyroid.  SKIN: Skin is warm and dry. No rash noted. Not diaphoretic. No erythema. No pallor. NEUROLOGIC: Alert and oriented to person, place, and time. Normal reflexes, muscle tone coordination. No cranial nerve deficit noted. PSYCHIATRIC: Normal mood and affect. Normal behavior. Normal judgment and thought content. CARDIOVASCULAR: Normal heart rate noted, regular rhythm RESPIRATORY: Clear to auscultation bilaterally. Effort and breath sounds normal, no problems with respiration noted. BREASTS: Symmetric in size. No masses, skin changes, nipple drainage, or lymphadenopathy. ABDOMEN: Soft, normal bowel sounds, no distention noted.  No tenderness, rebound or guarding.  PELVIC: Normal appearing external genitalia; normal appearing vaginal mucosa and cervix.  No abnormal discharge noted.  Pap smear obtained.  Normal uterine size, no other palpable masses, no uterine or adnexal tenderness. MUSCULOSKELETAL: Normal range of motion. No tenderness.  No cyanosis, clubbing, or edema.  2+ distal pulses.  Exam done with chaperone present.  Assessment and Plan:   1. Women's annual routine gynecological examination - Will follow up results of pap smear/STI screen and manage accordingly. - Encouraged improvement in  diet and exercise.  - Gardisil interested, but will have to get at [facility] due to insurance - Cervicovaginal ancillary only( [place])  2. Screening examination for infectious disease - HIV Antibody (routine testing w rflx) - RPR  Routine preventative health maintenance measures emphasized. Please refer to After Visit Summary for other counseling recommendations.   Total face-to-face time with patient: 16 minutes. Over 50% of encounter was spent on counseling and coordination of care.  Merilyn Baba, DO OB Fellow, [facility] 05/16/2019 11:33 AM

## 2019-05-16 NOTE — Progress Notes (Signed)
Pt is here for annual gyn exam. Pt is taking micronor for contraception, pt reports she is no longer breastfeeding. Last pap smear 07/26/17 normal.

## 2019-05-16 NOTE — Patient Instructions (Addendum)
Hormonal Contraception Information Hormonal contraception is a type of birth control that uses hormones to prevent pregnancy. It usually involves a combination of the hormones estrogen and progesterone or only the hormone progesterone. Hormonal contraception works in these ways:  It thickens the mucus in the cervix, making it harder for sperm to enter the uterus.  It changes the lining of the uterus, making it harder for an egg to implant.  It may stop the ovaries from releasing eggs (ovulation). Some women who take hormonal contraceptives that contain only progesterone may continue to ovulate. Hormonal contraception cannot prevent sexually transmitted infections (STIs). Pregnancy may still occur. Estrogen and progesterone contraceptives Contraceptives that use a combination of estrogen and progesterone are available in these forms:  Pill. Pills come in different combinations of hormones. They must be taken at the same time each day. Pills can affect your period, causing you to get your period once every three months or not at all.  Patch. The patch must be worn on the lower abdomen for three weeks and then removed on the fourth.  Vaginal ring. The ring is placed in the vagina and left there for three weeks. It is then removed for one week. Progesterone contraceptives Contraceptives that use progesterone only are available in these forms:  Pill. Pills should be taken every day of the cycle.  Intrauterine device (IUD). This device is inserted into the uterus and removed or replaced every five years or sooner.  Implant. Plastic rods are placed under the skin of the upper arm. They are removed or replaced every three years or sooner.  Injection. The injection is given once every 90 days. What are the side effects? The side effects of estrogen and progesterone contraceptives include:  Nausea.  Headaches.  Breast tenderness.  Bleeding or spotting between menstrual cycles.  High blood  pressure (rare).  Strokes, heart attacks, or blood clots (rare) Side effects of progesterone-only contraceptives include:  Nausea.  Headaches.  Breast tenderness.  Unpredictable menstrual bleeding.  High blood pressure (rare). Talk to your health care provider about what side effects may affect you. Where to find more information  Ask your health care provider for more information and resources about hormonal contraception.  [place] Department of Health and Programmer, systems on Women's Health: [place] Questions to ask:  What type of hormonal contraception is right for me?  How long should I plan to use hormonal contraception?  What are the side effects of the hormonal contraception method I choose?  How can I prevent STIs while using hormonal contraception? Contact a health care provider if:  You start taking hormonal contraceptives and you develop persistent or severe side effects. Summary  Estrogen and progesterone are hormones used in many forms of birth control.  Talk to your health care provider about what side effects may affect you.  Hormonal contraception cannot prevent sexually transmitted infections (STIs).  Ask your health care provider for more information and resources about hormonal contraception. This information is not intended to replace advice given to you by your health care provider. Make sure you discuss any questions you have with your health care provider. Document Revised: 08/01/2018 Document Reviewed: 03/06/2016 Elsevier Patient Education  Sac for Bacterial Vaginosis  Option #1 1 Tbsp Fractitionated Coconut Oil 10 drops of Melaleuca (Tea Tree) Oil  Mix ingredients together well.  Soak 3-4 tampons (in applicators) in that mixture until all or mostly all mixture is soaked up into the tampons.  Insert  1 saturated tampon vaginally and wear overnight for 3-4 nights.    Option #2 (sometimes to be  used in conjunction with option #1) Fill tub with enough to cover lap/lower abdomen warm water.  Mix 1/2 cup of baking soda in water.  Soak in water/baking soda mixture for at least 20 minutes.  Be sure to swish water in between legs to get as much in vagina as possible.  This soak should be done after sexual intercourse and menstrual cycles.    GO WHITE: Soap: UNSCENTED Dove (white box light green writing) Laundry detergent (underwear)- Dreft or [employer] unscented WHITE 100% cotton panties (NOT just cotton crouch) Sanitary napkin/panty liners: UNSCENTED.  If it doesn't SAY unscented it can have a scent/perfume    NO PERFUMES OR LOTIONS OR POTIONS in the vulvar area (may use regular [place]) Condoms: hypoallergenic only. Non dyed (no color) Toilet papers: white only Wash clothes: use a separate wash cloth. WHITE.  Wash in [place].   You can purchase Tea Tree Oil locally at:  [employer] [address] [place] [address]  [employer] [address] [place] [address]  Advise that these alternatives will not replace the need to be evaluated if symptoms persist. You will need to seek care at an OB/GYN provider.

## 2019-05-17 LAB — CERVICOVAGINAL ANCILLARY ONLY
Chlamydia: NEGATIVE
Comment: NEGATIVE
Comment: NORMAL
Neisseria Gonorrhea: NEGATIVE

## 2019-05-17 LAB — RPR: RPR Ser Ql: NONREACTIVE

## 2019-05-17 LAB — HIV ANTIBODY (ROUTINE TESTING W REFLEX): HIV Screen 4th Generation wRfx: NONREACTIVE

## 2020-03-07 ENCOUNTER — Ambulatory Visit: Payer: Medicaid Other

## 2020-05-17 ENCOUNTER — Other Ambulatory Visit: Payer: Self-pay

## 2020-05-17 DIAGNOSIS — Z3041 Encounter for surveillance of contraceptive pills: Secondary | ICD-10-CM

## 2020-05-17 MED ORDER — NORETHINDRONE 0.35 MG PO TABS: 1.0000 | ORAL_TABLET | Freq: Every day | ORAL | 5 refills | Status: DC

## 2020-10-28 ENCOUNTER — Other Ambulatory Visit: Payer: Self-pay | Admitting: Obstetrics

## 2020-10-28 DIAGNOSIS — Z3041 Encounter for surveillance of contraceptive pills: Secondary | ICD-10-CM

## 2020-11-01 ENCOUNTER — Other Ambulatory Visit: Payer: Self-pay | Admitting: Obstetrics

## 2020-11-01 ENCOUNTER — Telehealth: Payer: Self-pay

## 2020-11-01 DIAGNOSIS — Z3041 Encounter for surveillance of contraceptive pills: Secondary | ICD-10-CM

## 2020-11-01 MED ORDER — NORETHINDRONE 0.35 MG PO TABS: 1.0000 | ORAL_TABLET | Freq: Every day | ORAL | 0 refills | Status: DC

## 2020-11-01 NOTE — Telephone Encounter (Signed)
Pt called requesting refill on BCP. I informed pt her last annual was 04/2019. We sent 5 refills 04/2020 to give her time to come for an annual exam.  I can send one more pack but she will need to come for annual for further refills. Pt voiced understanding and agrees to schedule an annual appt ASAP.

## 2020-11-13 ENCOUNTER — Telehealth: Payer: Self-pay

## 2020-11-13 ENCOUNTER — Other Ambulatory Visit: Payer: Self-pay

## 2020-11-13 DIAGNOSIS — Z3041 Encounter for surveillance of contraceptive pills: Secondary | ICD-10-CM

## 2020-11-13 MED ORDER — NORETHINDRONE 0.35 MG PO TABS: 1.0000 | ORAL_TABLET | Freq: Every day | ORAL | 0 refills | Status: DC

## 2020-11-13 NOTE — Telephone Encounter (Signed)
BCP refilled pt has annual exam scheduled  Confirmed pt AEX and arrival time  Pt voiced understanding.

## 2020-11-21 ENCOUNTER — Other Ambulatory Visit: Payer: Self-pay | Admitting: Obstetrics and Gynecology

## 2020-11-21 DIAGNOSIS — Z3041 Encounter for surveillance of contraceptive pills: Secondary | ICD-10-CM

## 2020-12-17 ENCOUNTER — Other Ambulatory Visit: Payer: Self-pay | Admitting: Obstetrics

## 2020-12-17 DIAGNOSIS — Z3041 Encounter for surveillance of contraceptive pills: Secondary | ICD-10-CM

## 2020-12-18 ENCOUNTER — Encounter: Payer: Self-pay | Admitting: Obstetrics and Gynecology

## 2020-12-18 ENCOUNTER — Other Ambulatory Visit: Payer: Self-pay

## 2020-12-18 ENCOUNTER — Other Ambulatory Visit (HOSPITAL_COMMUNITY)
Admission: RE | Admit: 2020-12-18 | Discharge: 2020-12-18 | Disposition: A | Discharge status: Home / Self Care | Payer: Medicaid Other | Source: Ambulatory Visit | Attending: Women's Health | Admitting: Women's Health

## 2020-12-18 ENCOUNTER — Ambulatory Visit (INDEPENDENT_AMBULATORY_CARE_PROVIDER_SITE_OTHER): Payer: Medicaid Other | Admitting: Women's Health

## 2020-12-18 ENCOUNTER — Encounter: Payer: Self-pay | Admitting: Women's Health

## 2020-12-18 VITALS — BP 113/70 | HR 61 | Ht 62.0 in | Wt 158.0 lb

## 2020-12-18 DIAGNOSIS — Z3041 Encounter for surveillance of contraceptive pills: Secondary | ICD-10-CM

## 2020-12-18 DIAGNOSIS — Z113 Encounter for screening for infections with a predominantly sexual mode of transmission: Secondary | ICD-10-CM

## 2020-12-18 DIAGNOSIS — Z01419 Encounter for gynecological examination (general) (routine) without abnormal findings: Secondary | ICD-10-CM | POA: Diagnosis present

## 2020-12-18 MED ORDER — NORETHIN-ETH ESTRAD-FE BIPHAS 1 MG-10 MCG / 10 MCG PO TABS: 1.0000 | ORAL_TABLET | Freq: Every day | ORAL | 12 refills | Status: DC

## 2020-12-18 NOTE — Progress Notes (Signed)
Pt presents today for yearly exam. LMP: 12/09/20 BCM: OCP Last pap: 07/26/17 WNL. No complaints.

## 2020-12-18 NOTE — Patient Instructions (Addendum)
When using your birth control, if you experience any of the following, please call the office or report to the nearest emergency room immediately: -severe abdominal pain/weakness -chest pain/shortness of breath -the worst HA you have ever had in your life -sudden changes in vision -difficulty speaking -severe leg pain/redness/swelling Please also refer to the additional information you were given in the office today while using your birth control.       Norethindrone Acetate; Ethinyl Estradiol Oral Tablets (contraception) What is this medication? NORETHINDRONE ACETATE; ETHINYL ESTRADIOL (nor eth IN drone AS e tate; ETH in il es tra DYE ole) is an oral contraceptive. The products combine two types of female hormones, an estrogen and a progestin. These products prevent ovulation and pregnancy. This medicine may be used for other purposes; ask your health care provider or pharmacist if you have questions. COMMON BRAND NAME(S): Rolm Bookbinder, Hailey 1.5/30, Junel 1.5/30, Junel 1/20, LARIN, Loestrin 1.5/30, Loestrin 1/20, Microgestin 1.5/30, Microgestin 1/20 What should I tell my care team before I take this medication? They need to know if you have or ever had any of these conditions: abnormal vaginal bleeding blood vessel disease or blood clots breast, cervical, endometrial, ovarian, liver, or uterine cancer diabetes gallbladder disease having surgery heart disease or recent heart attack high blood pressure high cholesterol or triglycerides history of irregular heartbeat or heart valve problems kidney disease liver disease migraine headaches protein C deficiency protein S deficiency recently had a baby, miscarriage, or abortion stroke systemic lupus erythematosus (SLE) tobacco smoker an unusual or allergic reaction to estrogens, progestins, other medicines, foods, dyes, or preservatives pregnant or trying to get pregnant breast-feeding How should I use this  medication? Take this medicine by mouth. To reduce nausea, this medicine may be taken with food. Follow the directions on the prescription label. Take this medicine at the same time each day and in the order directed on the package. Do not take your medicine more often than directed. Contact your pediatrician regarding the use of this medicine in children. Special care may be needed. This medicine has been used in female children who have started having menstrual periods. A patient package insert for the product will be given with each prescription and refill. Read this sheet carefully each time. The sheet may change frequently. Overdosage: If you think you have taken too much of this medicine contact a poison control center or emergency room at once. NOTE: This medicine is only for you. Do not share this medicine with others. What if I miss a dose? If you miss a dose, refer to the patient information sheet you received with your medicine for direction. If you miss more than one pill, this medicine may not be as effective and you may need to use another form of birth control. What may interact with this medication? Do not take this medicine with the following medication: dasabuvir; ombitasvir; paritaprevir; ritonavir ombitasvir; paritaprevir; ritonavir This medicine may also interact with the following medications: acetaminophen antibiotics or medicines for infections, especially rifampin, rifabutin, rifapentine, and griseofulvin, and possibly penicillins or tetracyclines aprepitant ascorbic acid (vitamin C) atorvastatin barbiturate medicines, such as phenobarbital bosentan carbamazepine caffeine clofibrate cyclosporine dantrolene doxercalciferol felbamate grapefruit juice hydrocortisone medicines for anxiety or sleeping problems, such as diazepam or temazepam medicines for diabetes, including pioglitazone mineral  oil modafinil mycophenolate nefazodone oxcarbazepine phenytoin prednisolone ritonavir or other medicines for HIV infection or AIDS rosuvastatin selegiline soy isoflavones supplements St[place] John's wort tamoxifen or raloxifene theophylline thyroid hormones topiramate warfarin This list  may not describe all possible interactions. Give your health care provider a list of all the medicines, herbs, non-prescription drugs, or dietary supplements you use. Also tell them if you smoke, drink alcohol, or use illegal drugs. Some items may interact with your medicine. What should I watch for while using this medication? Visit your doctor or health care professional for regular checks on your progress. You will need a regular breast and pelvic exam and Pap smear while on this medicine. Use an additional method of contraception during the first cycle that you take these tablets. If you have any reason to think you are pregnant, stop taking this medicine right away and contact your doctor or health care professional. If you are taking this medicine for hormone related problems, it may take several cycles of use to see improvement in your condition. Smoking increases the risk of getting a blood clot or having a stroke while you are taking birth control pills, especially if you are more than 30 years old. You are strongly advised not to smoke. This medicine can make your body retain fluid, making your fingers, hands, or ankles swell. Your blood pressure can go up. Contact your doctor or health care professional if you feel you are retaining fluid. This medicine can make you more sensitive to the sun. Keep out of the sun. If you cannot avoid being in the sun, wear protective clothing and use sunscreen. Do not use sun lamps or tanning beds/booths. If you wear contact lenses and notice visual changes, or if the lenses begin to feel uncomfortable, consult your eye care specialist. In some women, tenderness,  swelling, or minor bleeding of the gums may occur. Notify your dentist if this happens. Brushing and flossing your teeth regularly may help limit this. See your dentist regularly and inform your dentist of the medicines you are taking. If you are going to have elective surgery, you may need to stop taking this medicine before the surgery. Consult your health care professional for advice. This medicine does not protect you against HIV infection (AIDS) or any other sexually transmitted diseases. What side effects may I notice from receiving this medication? Side effects that you should report to your doctor or health care professional as soon as possible: allergic reactions such as skin rash or itching, hives, swelling of the lips, mouth, tongue, or throat breast tissue changes or discharge dark patches of skin on your forehead, cheeks, upper lip, and chin depression high blood pressure migraines or severe, sudden headaches signs and symptoms of a blood clot such as breathing problems; changes in vision; chest pain; severe, sudden headache; pain, swelling, warmth in the leg; trouble speaking; sudden numbness or weakness of the face, arm or leg stomach pain symptoms of vaginal infection like itching, irritation or unusual discharge yellowing of the eyes or skin Side effects that usually do not require medical attention (report these to your doctor or health care professional if they continue or are bothersome): acne breast pain, tenderness irregular vaginal bleeding or spotting, particularly during the first month of use mild headache nausea weight gain (slight) This list may not describe all possible side effects. Call your doctor for medical advice about side effects. You may report side effects to FDA at 1-800-FDA-1088. Where should I keep my medication? Keep out of the reach of children. Store at room temperature between 15 and 30 degrees C (59 and 86 degrees F). Throw away any unused  medicine after the expiration date. NOTE: This sheet is  a summary. It may not cover all possible information. If you have questions about this medicine, talk to your doctor, pharmacist, or health care provider.  2022 Elsevier/Gold Standard (2019-02-23 18:13:00)       Preventive Care 71-55 Years Old, Female Preventive care refers to lifestyle choices and visits with your health care provider that can promote health and wellness. This includes: A yearly physical exam. This is also called an annual wellness visit. Regular dental and eye exams. Immunizations. Screening for certain conditions. Healthy lifestyle choices, such as: Eating a healthy diet. Getting regular exercise. Not using drugs or products that contain nicotine and tobacco. Limiting alcohol use. What can I expect for my preventive care visit? Physical exam Your health care provider may check your: Height and weight. These may be used to calculate your BMI (body mass index). BMI is a measurement that tells if you are at a healthy weight. Heart rate and blood pressure. Body temperature. Skin for abnormal spots. Counseling Your health care provider may ask you questions about your: Past medical problems. Family's medical history. Alcohol, tobacco, and drug use. Emotional well-being. Home life and relationship well-being. Sexual activity. Diet, exercise, and sleep habits. Work and work Statistician. Access to firearms. Method of birth control. Menstrual cycle. Pregnancy history. What immunizations do I need? Vaccines are usually given at various ages, according to a schedule. Your health care provider will recommend vaccines for you based on your age, medical history, and lifestyle or other factors, such as travel or where you work. What tests do I need? Blood tests Lipid and cholesterol levels. These may be checked every 5 years starting at age 74. Hepatitis C test. Hepatitis B test. Screening Diabetes  screening. This is done by checking your blood sugar (glucose) after you have not eaten for a while (fasting). STD (sexually transmitted disease) testing, if you are at risk. BRCA-related cancer screening. This may be done if you have a family history of breast, ovarian, tubal, or peritoneal cancers. Pelvic exam and Pap test. This may be done every 3 years starting at age 39. Starting at age 53, this may be done every 5 years if you have a Pap test in combination with an HPV test. Talk with your health care provider about your test results, treatment options, and if necessary, the need for more tests. Follow these instructions at home: Eating and drinking  Eat a healthy diet that includes fresh fruits and vegetables, whole grains, lean protein, and low-fat dairy products. Take vitamin and mineral supplements as recommended by your health care provider. Do not drink alcohol if: Your health care provider tells you not to drink. You are pregnant, may be pregnant, or are planning to become pregnant. If you drink alcohol: Limit how much you have to 0-1 drink a day. Be aware of how much alcohol is in your drink. In the [place] one drink equals one 12 oz bottle of beer (355 mL), one 5 oz glass of wine (148 mL), or one 1 oz glass of hard liquor (44 mL). Lifestyle Take daily care of your teeth and gums. Brush your teeth every morning and night with fluoride toothpaste. Floss one time each day. Stay active. Exercise for at least 30 minutes 5 or more days each week. Do not use any products that contain nicotine or tobacco, such as cigarettes, e-cigarettes, and chewing tobacco. If you need help quitting, ask your health care provider. Do not use drugs. If you are sexually active, practice safe sex. Use a  condom or other form of protection to prevent STIs (sexually transmitted infections). If you do not wish to become pregnant, use a form of birth control. If you plan to become pregnant, see your health care  provider for a prepregnancy visit. Find healthy ways to cope with stress, such as: Meditation, yoga, or listening to music. Journaling. Talking to a trusted person. Spending time with friends and family. Safety Always wear your seat belt while driving or riding in a vehicle. Do not drive: If you have been drinking alcohol. Do not ride with someone who has been drinking. When you are tired or distracted. While texting. Wear a helmet and other protective equipment during sports activities. If you have firearms in your house, make sure you follow all gun safety procedures. Seek help if you have been physically or sexually abused. What's next? Go to your health care provider once a year for an annual wellness visit. Ask your health care provider how often you should have your eyes and teeth checked. Stay up to date on all vaccines. This information is not intended to replace advice given to you by your health care provider. Make sure you discuss any questions you have with your health care provider. Document Revised: 06/14/2020 Document Reviewed: 12/16/2017 Elsevier Patient Education  2022 [place] Breast self-awareness means being familiar with how your breasts look and feel. It involves checking your breasts regularly and reporting any changes to your health care provider. Practicing breast self-awareness is important. Sometimes changes may not be harmful (are benign), but sometimes a change in your breasts can be a sign of a serious medical problem. It is important to learn how to do this procedure correctly so that you can catch problems early, when treatment is more likely to be successful. All women should practice breast self-awareness, including women who have had breast implants. What you need: A mirror. A well-lit room. How to do a breast self-exam A breast self-exam is one way to learn what is normal for your breasts and whether your breasts  are changing. To do a breast self-exam: Look for changes  Remove all the clothing above your waist. Stand in front of a mirror in a room with good lighting. Put your hands on your hips. Push your hands firmly downward. Compare your breasts in the mirror. Look for differences between them (asymmetry), such as: Differences in shape. Differences in size. Puckers, dips, and bumps in one breast and not the other. Look at each breast for changes in the skin, such as: Redness. Scaly areas. Look for changes in your nipples, such as: Discharge. Bleeding. Dimpling. Redness. A change in position. Feel for changes Carefully feel your breasts for lumps and changes. It is best to do this while lying on your back on the floor, and again while sitting or standing in the tub or shower with soapy water on your skin. Feel each breast in the following way: Place the arm on the side of the breast you are examining above your head. Feel your breast with the other hand. Start in the nipple area and make -inch (2 cm) overlapping circles to feel your breast. Use the pads of your three middle fingers to do this. Apply light pressure, then medium pressure, then firm pressure. The light pressure will allow you to feel the tissue closest to the skin. The medium pressure will allow you to feel the tissue that is a little deeper. The firm pressure  will allow you to feel the tissue close to the ribs. Continue the overlapping circles, moving downward over the breast until you feel your ribs below your breast. Move one finger-width toward the center of the body. Continue to use the -inch (2 cm) overlapping circles to feel your breast as you move slowly up toward your collarbone. Continue the up-and-down exam using all three pressures until you reach your armpit.  Write down what you find Writing down what you find can help you remember what to discuss with your health care provider. Write down: What is normal for  each breast. Any changes that you find in each breast, including: The kind of changes you find. Any pain or tenderness. Size and location of any lumps. Where you are in your menstrual cycle, if you are still menstruating. General tips and recommendations Examine your breasts every month. If you are breastfeeding, the best time to examine your breasts is after a feeding or after using a breast pump. If you menstruate, the best time to examine your breasts is 5-7 days after your period. Breasts are generally lumpier during menstrual periods, and it may be more difficult to notice changes. With time and practice, you will become more familiar with the variations in your breasts and more comfortable with the exam. Contact a health care provider if you: See a change in the shape or size of your breasts or nipples. See a change in the skin of your breast or nipples, such as a reddened or scaly area. Have unusual discharge from your nipples. Find a lump or thick area that was not there before. Have pain in your breasts. Have any concerns related to your breast health. Summary Breast self-awareness includes looking for physical changes in your breasts, as well as feeling for any changes within your breasts. Breast self-awareness should be performed in front of a mirror in a well-lit room. You should examine your breasts every month. If you menstruate, the best time to examine your breasts is 5-7 days after your menstrual period. Let your health care provider know of any changes you notice in your breasts, including changes in size, changes on the skin, pain or tenderness, or unusual fluid from your nipples. This information is not intended to replace advice given to you by your health care provider. Make sure you discuss any questions you have with your health care provider. Document Revised: 11/23/2017 Document Reviewed: 11/23/2017 Elsevier Patient Education  [place].

## 2020-12-18 NOTE — Progress Notes (Signed)
GYNECOLOGY ANNUAL PREVENTATIVE CARE ENCOUNTER NOTE  History:     Evelyn Kennedy is a 30 y.o. [phone] female here for a routine annual gynecologic exam.  Current complaints: none.  Denies abnormal vaginal bleeding, discharge, pelvic pain, problems with intercourse or other gynecologic concerns. Pt requests STD testing today. Pt reports she does not perform SBE. Pt denies bowel or bladder concerns. No family hx of colon, endometrial or ovarian cancer. Mom's first cousin had breast cancer age 55. Pt does not smoke, drink. Pt reports smoking marijuana for stress relief, discusses alternative medications and BH assessment and pt will let us know if she is interested.     Gynecologic History Patient's last menstrual period was 12/09/2020. Menstruation: once per month, 5 days, moderate bleeding, mild dysmenorrhea. Contraception: oral progesterone-only contraceptive. Pt reports she does not desire pregnancy in the next year. Wishes to switch back to Lo Loestrin that she was on before. Last Pap: 07/2017. Results were: normal per patient. Pt denies history of abnormal Pap. NKDA PMH: PCOS Meds: norethindrone  Obstetric History OB History  Gravida Para Term Preterm AB Living  3 2 2   1 2   SAB IAB Ectopic Multiple Live Births    1   0 2    # Outcome Date GA Lbr Len/2nd Weight Sex Delivery Anes PTL Lv  3 Term 06/13/17 [redacted]w[redacted]d  7 lb 9.7 oz (3.45 kg) F CS-LTranv Spinal  LIV  2 IAB 05/04/14          1 Term 05/15/12 [redacted]w[redacted]d -11:49 / 00:40 7 lb 0.3 oz (3.184 kg) F Vag-Spont EPI  LIV    History reviewed. No pertinent past medical history.  Past Surgical History:  Procedure Laterality Date   CESAREAN SECTION N/A 06/13/2017   Procedure: CESAREAN SECTION;  Surgeon: 06/15/2017, MD;  Location: [facility];  Service: Obstetrics;  Laterality: N/A;   TONSILLECTOMY      Current Outpatient Medications on File Prior to Visit  Medication Sig Dispense Refill   norethindrone (MICRONOR) 0.35  MG tablet TAKE 1 TABLET BY MOUTH EVERY DAY 30 tablet 0   No current facility-administered medications on file prior to visit.    No Known Allergies  Social History:  reports that she has never smoked. She has never used smokeless tobacco. She reports that she does not drink alcohol and does not use drugs.  Family History  Problem Relation Age of Onset   Hypertension Father    Diabetes Maternal Grandfather    Stroke Maternal Grandfather    Heart attack Paternal Grandfather    Breast cancer Cousin    Alcohol abuse Neg Hx    Arthritis Neg Hx    Asthma Neg Hx    Birth defects Neg Hx    Cancer Neg Hx    COPD Neg Hx    Depression Neg Hx    Drug abuse Neg Hx    Early death Neg Hx    Hearing loss Neg Hx    Heart disease Neg Hx    Hyperlipidemia Neg Hx    Kidney disease Neg Hx    Learning disabilities Neg Hx    Mental illness Neg Hx    Mental retardation Neg Hx     The following portions of the patient's history were reviewed and updated as appropriate: allergies, current medications, past family history, past medical history, past social history, past surgical history and problem list.  Review of Systems Pertinent items noted in HPI and remainder of comprehensive  ROS otherwise negative.  Physical Exam:  BP 113/70   Pulse 61   Ht 5\' 2"  (1.575 m)   Wt 158 lb (71.7 kg)   LMP 12/09/2020   BMI 28.90 kg/m   CONSTITUTIONAL: Well-developed, well-nourished female in no acute distress.  HENT:  Normocephalic, atraumatic, External right and left ear normal. EYES: Conjunctivae and EOM are normal. Pupils are equal, round, and reactive to light. No scleral icterus.  NECK: Normal range of motion, supple, no masses.  Normal thyroid.  SKIN: Skin is warm and dry. No  rash noted. Not diaphoretic. No erythema. No pallor. MUSCULOSKELETAL: Normal range of motion. No tenderness.  No cyanosis, clubbing, or edema. NEUROLOGIC: Alert and oriented to person, place, and time. Normal reflexes, muscle  tone coordination. PSYCHIATRIC: Normal mood and affect. Normal behavior. Normal judgment and thought content. CARDIOVASCULAR: Normal heart rate noted, regular rhythm. RESPIRATORY: Clear to auscultation bilaterally. Effort and breath sounds normal, no problems with respiration noted. BREASTS: Symmetric in size. No masses, skin changes, nipple drainage, or lymphadenopathy. ABDOMEN: Soft, normal bowel sounds, no distention noted.  No tenderness, rebound or guarding.  PELVIC: Normal appearing external genitalia; normal appearing vaginal mucosa and cervix.  No abnormal discharge noted.  Pap smear obtained.  Normal uterine size, no other palpable masses, no uterine or adnexal tenderness.   Assessment and Plan:    1. Well woman exam - Cytology - PAP - Norethindrone-Ethinyl Estradiol-Fe Biphas (LO LOESTRIN FE) 1 MG-10 MCG / 10 MCG tablet; Take 1 tablet by mouth daily.  Dispense: 28 tablet; Refill: 12  2. Routine screening for STI (sexually transmitted infection) - Cervicovaginal ancillary only( [facility]) - RPR+HBsAg+HCVAb+...  Will follow up results of pap smear and other testing, if performed, and manage accordingly. Routine preventative health maintenance measures emphasized. Self-breast awareness taught, importance discussed, advised when to RTC, SBA literature given. Please refer to After Visit Summary for other counseling recommendations.      12/11/2020, [facility] Nurse Practitioner, [facility]

## 2020-12-19 ENCOUNTER — Telehealth: Payer: Self-pay | Admitting: Women's Health

## 2020-12-19 DIAGNOSIS — A749 Chlamydial infection, unspecified: Secondary | ICD-10-CM

## 2020-12-19 LAB — CERVICOVAGINAL ANCILLARY ONLY
Chlamydia: POSITIVE — AB
Comment: NEGATIVE
Comment: NEGATIVE
Comment: NORMAL
Neisseria Gonorrhea: NEGATIVE
Trichomonas: NEGATIVE

## 2020-12-19 LAB — RPR+HBSAG+HCVAB+...
HIV Screen 4th Generation wRfx: NONREACTIVE
Hep C Virus Ab: <0.1 s/co ratio (ref 0.0–0.9)
Hepatitis B Surface Ag: NEGATIVE
RPR Ser Ql: NONREACTIVE

## 2020-12-19 MED ORDER — AZITHROMYCIN 500 MG PO TABS: 1000.0000 mg | ORAL_TABLET | Freq: Once | ORAL | 0 refills | Status: AC

## 2020-12-19 NOTE — Telephone Encounter (Signed)
Patient called and identified by two identifiers. Patient alerted to +CT result. NKDA reported in patient chart, only medication is lo loestrin. Patient advised partner should be tested and treated as well and she should refrain from intercourse from the time she takes the medication until 7 days after her partner gets treated. Pt verbalizes understanding and agrees with plan. RX azithromycin sent to pharmacy.  Marylen Ponto, NP  4:15 PM 12/19/2020

## 2020-12-20 ENCOUNTER — Other Ambulatory Visit: Payer: Self-pay | Admitting: *Deleted

## 2020-12-20 ENCOUNTER — Encounter: Payer: Self-pay | Admitting: *Deleted

## 2020-12-20 DIAGNOSIS — A749 Chlamydial infection, unspecified: Secondary | ICD-10-CM

## 2020-12-20 LAB — CYTOLOGY - PAP
Comment: NEGATIVE
Diagnosis: NEGATIVE
High risk HPV: NEGATIVE

## 2020-12-20 MED ORDER — AZITHROMYCIN 500 MG PO TABS: 1000.0000 mg | ORAL_TABLET | Freq: Once | ORAL | 0 refills | Status: AC

## 2021-01-07 ENCOUNTER — Other Ambulatory Visit: Payer: Self-pay | Admitting: Obstetrics

## 2021-01-07 DIAGNOSIS — Z3041 Encounter for surveillance of contraceptive pills: Secondary | ICD-10-CM

## 2021-12-29 ENCOUNTER — Other Ambulatory Visit: Payer: Self-pay

## 2021-12-29 DIAGNOSIS — Z01419 Encounter for gynecological examination (general) (routine) without abnormal findings: Secondary | ICD-10-CM

## 2021-12-29 MED ORDER — NORETHIN-ETH ESTRAD-FE BIPHAS 1 MG-10 MCG / 10 MCG PO TABS: 1.0000 | ORAL_TABLET | Freq: Every day | ORAL | 1 refills | Status: DC

## 2022-03-07 ENCOUNTER — Other Ambulatory Visit: Payer: Self-pay | Admitting: Obstetrics and Gynecology

## 2022-03-07 DIAGNOSIS — Z01419 Encounter for gynecological examination (general) (routine) without abnormal findings: Secondary | ICD-10-CM

## 2022-12-17 ENCOUNTER — Other Ambulatory Visit (HOSPITAL_COMMUNITY)
Admission: RE | Admit: 2022-12-17 | Discharge: 2022-12-17 | Disposition: A | Discharge status: Home / Self Care | Payer: Medicaid Other | Source: Ambulatory Visit | Attending: Obstetrics and Gynecology | Admitting: Obstetrics and Gynecology

## 2022-12-17 ENCOUNTER — Ambulatory Visit (INDEPENDENT_AMBULATORY_CARE_PROVIDER_SITE_OTHER): Payer: Medicaid Other | Admitting: Obstetrics and Gynecology

## 2022-12-17 ENCOUNTER — Encounter: Payer: Self-pay | Admitting: Obstetrics and Gynecology

## 2022-12-17 ENCOUNTER — Ambulatory Visit: Payer: Medicaid Other | Admitting: Obstetrics and Gynecology

## 2022-12-17 VITALS — BP 123/92 | HR 87 | Ht 62.0 in | Wt 156.1 lb

## 2022-12-17 DIAGNOSIS — Z01419 Encounter for gynecological examination (general) (routine) without abnormal findings: Secondary | ICD-10-CM | POA: Diagnosis not present

## 2022-12-17 DIAGNOSIS — Z113 Encounter for screening for infections with a predominantly sexual mode of transmission: Secondary | ICD-10-CM | POA: Insufficient documentation

## 2022-12-17 DIAGNOSIS — Z1331 Encounter for screening for depression: Secondary | ICD-10-CM

## 2022-12-17 NOTE — Progress Notes (Addendum)
ANNUAL EXAM Patient name: Evelyn Kennedy MRN [id number]  Date of birth: 1990/11/24 Chief Complaint:   Annual  History of Present Illness:   Evelyn Kennedy is a 32 y.o. [phone]  female being seen today for a routine annual exam.  Current complaints: Doing well on cycle Loestrin, takes it continuously. No complaints. Desires std screen.  Patient's last menstrual period was 10/02/2022.   Upstream - 12/17/22 1542       Pregnancy Intention Screening   Does the patient want to become pregnant in the next year? No    Does the patient's partner want to become pregnant in the next year? No      Contraception Wrap Up   Current Method Oral Contraceptive    End Method Oral Contraceptive    Contraception Counseling Provided No            The pregnancy intention screening data noted above was reviewed. Potential methods of contraception were discussed. The patient elected to proceed with Oral Contraceptive.   Last pap 12/18/20. Results were: NILM w/ HRHPV negative. H/O abnormal pap: yes (2016) Last mammogram: n/a. Results were: N/A. Family h/o breast cancer: no Last colonoscopy: n/a. Results were: n/a. Family h/o colorectal cancer: no     12/17/2022    3:40 PM  Depression screen PHQ 2/9  Decreased Interest 0  Down, Depressed, Hopeless 0  PHQ - 2 Score 0  Altered sleeping 0  Tired, decreased energy 0  Change in appetite 0  Feeling bad or failure about yourself  0  Trouble concentrating 0  Moving slowly or fidgety/restless 0  Suicidal thoughts 0  PHQ-9 Score 0        12/17/2022    3:41 PM  GAD 7 : Generalized Anxiety Score  Nervous, Anxious, on Edge 1  Control/stop worrying 1  Worry too much - different things 0  Trouble relaxing 0  Restless 0  Easily annoyed or irritable 0  Afraid - awful might happen 0  Total GAD 7 Score 2     Review of Systems:   Pertinent items are noted in HPI Denies any headaches, blurred vision, fatigue, shortness of breath, chest pain,  abdominal pain, abnormal vaginal discharge/itching/odor/irritation, problems with periods, bowel movements, urination, or intercourse unless otherwise stated above. Pertinent History Reviewed:  Reviewed past medical,surgical, social and family history.  Reviewed problem list, medications and allergies. Physical Assessment:   Vitals:   12/17/22 1532  BP: (!) 123/92  Pulse: 87  Weight: 156 lb 1.6 oz (70.8 kg)  Height: 5\' 2"  (1.575 m)  Body mass index is 28.55 kg/m.        Physical Examination:   General appearance - well appearing, and in no distress  Mental status - alert, oriented   Psych:  She has a normal mood and affect  Skin - warm and dry, normal color, no suspicious lesions noted  Chest - effort normal, all lung fields clear to auscultation bilaterally  Heart - normal rate and regular rhythm  Neck:  midline trachea, no thyromegaly or nodules  Breasts - breasts appear normal, no suspicious masses, no skin or nipple changes or  axillary nodes  Abdomen - soft  Pelvic -deferred   Extremities:  No swelling or varicosities noted  Chaperone present for exam  No results found for this or any previous visit (from the past 24 hour(s)).  Assessment & Plan:  1. Well woman exam UTD on pap, due next year  2. Screen for STD (sexually transmitted disease)  -  Cervicovaginal ancillary only( [place]) - Hepatitis B Surface AntiGEN - HIV Antibody (routine testing w rflx) - RPR - Hepatitis C Antibody   Labs/procedures today:   Mammogram: @ 32yo, or sooner if problems Colonoscopy: @ 32yo, or sooner if problems  Orders Placed This Encounter  Procedures   Hepatitis B Surface AntiGEN   HIV Antibody (routine testing w rflx)   RPR   Hepatitis C Antibody    Meds: No orders of the defined types were placed in this encounter.   Follow-up: Return in about 1 year (around 12/17/2023) for Thayer Jew, FNP

## 2022-12-18 LAB — HIV ANTIBODY (ROUTINE TESTING W REFLEX): HIV Screen 4th Generation wRfx: NONREACTIVE

## 2022-12-18 LAB — HEPATITIS B SURFACE ANTIGEN: Hepatitis B Surface Ag: NEGATIVE

## 2022-12-18 LAB — HEPATITIS C ANTIBODY: Hep C Virus Ab: NONREACTIVE

## 2022-12-18 LAB — RPR: RPR Ser Ql: NONREACTIVE

## 2022-12-21 ENCOUNTER — Other Ambulatory Visit: Payer: Self-pay | Admitting: Obstetrics and Gynecology

## 2022-12-21 DIAGNOSIS — B379 Candidiasis, unspecified: Secondary | ICD-10-CM

## 2022-12-21 DIAGNOSIS — N760 Acute vaginitis: Secondary | ICD-10-CM

## 2022-12-21 MED ORDER — METRONIDAZOLE 500 MG PO TABS: 500.0000 mg | ORAL_TABLET | Freq: Two times a day (BID) | ORAL | 0 refills | Status: AC

## 2022-12-21 MED ORDER — FLUCONAZOLE 150 MG PO TABS: 150.0000 mg | ORAL_TABLET | Freq: Once | ORAL | 0 refills | Status: AC

## 2022-12-21 NOTE — Progress Notes (Signed)
Orders only

## 2022-12-24 LAB — CERVICOVAGINAL ANCILLARY ONLY
Bacterial Vaginitis (gardnerella): POSITIVE — AB
Candida Glabrata: NEGATIVE
Candida Vaginitis: POSITIVE — AB
Chlamydia: NEGATIVE
Comment: NEGATIVE
Comment: NEGATIVE
Comment: NEGATIVE
Comment: NEGATIVE
Comment: NEGATIVE
Comment: NORMAL
Neisseria Gonorrhea: NEGATIVE
Trichomonas: NEGATIVE

## 2022-12-31 MED ORDER — FLUCONAZOLE 150 MG PO TABS: 150.0000 mg | ORAL_TABLET | Freq: Once | ORAL | 0 refills | Status: AC

## 2023-01-21 ENCOUNTER — Other Ambulatory Visit: Payer: Self-pay | Admitting: Obstetrics and Gynecology

## 2023-01-21 DIAGNOSIS — Z01419 Encounter for gynecological examination (general) (routine) without abnormal findings: Secondary | ICD-10-CM

## 2023-04-02 ENCOUNTER — Telehealth: Payer: Self-pay

## 2023-04-02 MED ORDER — FLUCONAZOLE 150 MG PO TABS: 150.0000 mg | ORAL_TABLET | Freq: Once | ORAL | 0 refills | Status: DC

## 2023-04-02 NOTE — Telephone Encounter (Signed)
Returned call, pt reporting thick white discharge and vaginal itching, requesting rx. Sent rx per protocol.

## 2023-04-17 ENCOUNTER — Other Ambulatory Visit: Payer: Self-pay | Admitting: Obstetrics and Gynecology

## 2023-04-17 DIAGNOSIS — Z01419 Encounter for gynecological examination (general) (routine) without abnormal findings: Secondary | ICD-10-CM

## 2023-07-28 ENCOUNTER — Other Ambulatory Visit: Payer: Self-pay

## 2023-07-28 MED ORDER — FLUCONAZOLE 150 MG PO TABS: 150.0000 mg | ORAL_TABLET | Freq: Once | ORAL | 0 refills | Status: AC

## 2023-07-28 NOTE — Progress Notes (Signed)
 TC from pt reported itching, and discharge. Pt believes she has yeast infection. Rx sent per protocol.

## 2023-12-06 ENCOUNTER — Telehealth: Payer: Self-pay

## 2023-12-06 ENCOUNTER — Other Ambulatory Visit: Payer: Self-pay

## 2023-12-06 DIAGNOSIS — B379 Candidiasis, unspecified: Secondary | ICD-10-CM

## 2023-12-06 MED ORDER — FLUCONAZOLE 150 MG PO TABS: 150.0000 mg | ORAL_TABLET | Freq: Once | ORAL | 0 refills | Status: AC

## 2023-12-06 NOTE — Telephone Encounter (Signed)
 Call placed to patient. Pt reports vaginal itching and pulsing in vagina since 12/03/23. Pt states that this is how her yeast infections start. Pt is aware that needs to schedule an annual for next month and will contact our office for scheduling.

## 2023-12-28 ENCOUNTER — Other Ambulatory Visit: Payer: Self-pay

## 2023-12-28 ENCOUNTER — Ambulatory Visit (INDEPENDENT_AMBULATORY_CARE_PROVIDER_SITE_OTHER)

## 2023-12-28 ENCOUNTER — Other Ambulatory Visit (HOSPITAL_COMMUNITY)
Admission: RE | Admit: 2023-12-28 | Discharge: 2023-12-28 | Disposition: A | Discharge status: Home / Self Care | Source: Ambulatory Visit | Attending: Obstetrics and Gynecology | Admitting: Obstetrics and Gynecology

## 2023-12-28 VITALS — BP 134/82 | HR 77

## 2023-12-28 DIAGNOSIS — N898 Other specified noninflammatory disorders of vagina: Secondary | ICD-10-CM

## 2023-12-28 DIAGNOSIS — B379 Candidiasis, unspecified: Secondary | ICD-10-CM

## 2023-12-28 NOTE — Progress Notes (Signed)
..  SUBJECTIVE:  33 y.o. female complains of vaginal discharge and irritation for 4 day(s). Denies abnormal vaginal bleeding or significant pelvic pain or fever. No UTI symptoms. Denies history of known exposure to STD. Pt states that she started using a new soap recently.  No LMP recorded.  OBJECTIVE:  She appears well, afebrile. Urine dipstick: not done.  ASSESSMENT:  Vaginal Discharge  Vaginal Irritation   PLAN:  GC, chlamydia, trichomonas, BVAG, CVAG probe sent to lab. Treatment: To be determined once lab results are received ROV prn if symptoms persist or worsen.

## 2023-12-28 NOTE — Progress Notes (Signed)
 Returned call, pt states that she has been having vaginal irritation and some discharge that looks like yeast. Pt reports symptoms starting after using antibacterial soap, advised to come in for self swab, annual scheduled for 9/25.

## 2023-12-29 LAB — CERVICOVAGINAL ANCILLARY ONLY
Bacterial Vaginitis (gardnerella): NEGATIVE
Candida Glabrata: NEGATIVE
Candida Vaginitis: POSITIVE — AB
Chlamydia: NEGATIVE
Comment: NEGATIVE
Comment: NEGATIVE
Comment: NEGATIVE
Comment: NEGATIVE
Comment: NEGATIVE
Comment: NORMAL
Neisseria Gonorrhea: NEGATIVE
Trichomonas: NEGATIVE

## 2023-12-30 ENCOUNTER — Ambulatory Visit: Payer: Self-pay | Admitting: Obstetrics and Gynecology

## 2023-12-30 MED ORDER — FLUCONAZOLE 150 MG PO TABS: 150.0000 mg | ORAL_TABLET | Freq: Once | ORAL | 0 refills | Status: AC

## 2024-01-13 ENCOUNTER — Other Ambulatory Visit (HOSPITAL_COMMUNITY)
Admission: RE | Admit: 2024-01-13 | Discharge: 2024-01-13 | Disposition: A | Source: Ambulatory Visit | Attending: Obstetrics and Gynecology | Admitting: Obstetrics and Gynecology

## 2024-01-13 ENCOUNTER — Encounter: Payer: Self-pay | Admitting: Obstetrics and Gynecology

## 2024-01-13 ENCOUNTER — Ambulatory Visit (INDEPENDENT_AMBULATORY_CARE_PROVIDER_SITE_OTHER): Admitting: Obstetrics and Gynecology

## 2024-01-13 VITALS — BP 108/68 | HR 61 | Ht 61.75 in | Wt 163.4 lb

## 2024-01-13 DIAGNOSIS — Z3041 Encounter for surveillance of contraceptive pills: Secondary | ICD-10-CM

## 2024-01-13 DIAGNOSIS — Z01419 Encounter for gynecological examination (general) (routine) without abnormal findings: Secondary | ICD-10-CM | POA: Diagnosis not present

## 2024-01-13 DIAGNOSIS — N898 Other specified noninflammatory disorders of vagina: Secondary | ICD-10-CM | POA: Insufficient documentation

## 2024-01-13 DIAGNOSIS — Z113 Encounter for screening for infections with a predominantly sexual mode of transmission: Secondary | ICD-10-CM

## 2024-01-13 MED ORDER — LO LOESTRIN FE 1 MG-10 MCG / 10 MCG PO TABS: 1.0000 | ORAL_TABLET | Freq: Every day | ORAL | 11 refills | Status: AC

## 2024-01-13 NOTE — Progress Notes (Signed)
 Pt presents for AEX  Last PAP 11/2020 Declines PAP today  Need BC refill  Pt recently had a yeast infection. Wants to swab today to see if it has resolved

## 2024-01-13 NOTE — Progress Notes (Signed)
 ANNUAL EXAM Patient name: Evelyn Kennedy MRN [id number]  Date of birth: April 12, 1991 Chief Complaint:   No chief complaint on file.  History of Present Illness:   Evelyn Kennedy is a 33 y.o. [phone]  female being seen today for a routine annual exam.  Current complaints: desires to continue birth control for now  Does not have pcp Had yeast infection that was treated, would like to check discharge  Patient's last menstrual period was 12/25/2023.   The pregnancy intention screening data noted above was reviewed. Potential methods of contraception were discussed. The patient elected to proceed with OCP  Gynecologic History Patient's last menstrual period was . Contraception: OCP Last Pap: 08/2020. Results were: Normal Last mammogram: n/a     01/13/2024    2:25 PM 12/17/2022    3:40 PM  Depression screen PHQ 2/9  Decreased Interest 1 0  Down, Depressed, Hopeless 0 0  PHQ - 2 Score 1 0  Altered sleeping 0 0  Tired, decreased energy 1 0  Change in appetite 0 0  Feeling bad or failure about yourself  0 0  Trouble concentrating 1 0  Moving slowly or fidgety/restless 0 0  Suicidal thoughts 0 0  PHQ-9 Score 3 0        01/13/2024    2:28 PM 12/17/2022    3:41 PM  GAD 7 : Generalized Anxiety Score  Nervous, Anxious, on Edge 0 1  Control/stop worrying 0 1  Worry too much - different things 1 0  Trouble relaxing 0 0  Restless 0 0  Easily annoyed or irritable 1 0  Afraid - awful might happen 0 0  Total GAD 7 Score 2 2     Review of Systems:   Pertinent items are noted in HPI Denies any headaches, blurred vision, fatigue, shortness of breath, chest pain, abdominal pain, abnormal vaginal discharge/itching/odor/irritation, problems with periods, bowel movements, urination, or intercourse unless otherwise stated above. Pertinent History Reviewed:  Reviewed past medical,surgical, social and family history.  Reviewed problem list, medications and allergies. Physical Assessment:    Vitals:   01/13/24 1420  BP: 108/68  Pulse: 61  Weight: 163 lb 6.4 oz (74.1 kg)  Height: 5' 1.75 (1.568 m)  Body mass index is 30.13 kg/m.        Physical Examination:   General appearance - well appearing, and in no distress  Mental status - alert, oriented   Psych:  She has a normal mood and affect  Skin - warm and dry, normal color  Chest - effort normal, all lung fields clear to auscultation bilaterally  Heart - normal rate and regular rhythm  Neck:  midline trachea, no thyromegaly or nodules  Breasts - breasts appear normal, no suspicious masses, no skin or nipple changes or  axillary nodes  Abdomen - soft, nontender, nondistended  Pelvic -deferred  Extremities:  No swelling or varicosities noted  Chaperone present for exam  No results found for this or any previous visit (from the past 24 hours).  Assessment & Plan:  1. Encounter for annual routine gynecological examination (Primary) UTD pap Mammogram at 40 Annual bloodwork today   - CBC - Comp Met ([facility]) - Lipid panel - HgB A1c  2. Surveillance for birth control, oral contraceptives refill sent  - Norethindrone -Ethinyl Estradiol-Fe Biphas (LO LOESTRIN FE ) 1 MG-10 MCG / 10 MCG tablet; Take 1 tablet by mouth daily.  Dispense: 28 tablet; Refill: 11  3. Vaginal discharge  - Cervicovaginal ancillary only( [place])  4. Screen for STD (sexually transmitted disease)  - HIV antibody (with reflex) - RPR - Hepatitis C Antibody - Hepatitis B Surface AntiGEN   Labs/procedures today:   Mammogram: @ 33yo, or sooner if problems Colonoscopy: @ 33yo, or sooner if problems  Orders Placed This Encounter  Procedures   HIV antibody (with reflex)   RPR   Hepatitis C Antibody   Hepatitis B Surface AntiGEN   CBC   Comp Met ([facility])   Lipid panel   HgB A1c    Meds:  Meds ordered this encounter  Medications   Norethindrone -Ethinyl Estradiol-Fe Biphas (LO LOESTRIN FE ) 1 MG-10 MCG / 10 MCG tablet    Sig: Take  1 tablet by mouth daily.    Dispense:  28 tablet    Refill:  11    Follow-up: Return in about 1 year (around 01/12/2025) for [place] Nidia Delores, FNP

## 2024-01-14 ENCOUNTER — Ambulatory Visit: Payer: Self-pay | Admitting: Obstetrics and Gynecology

## 2024-01-14 LAB — COMPREHENSIVE METABOLIC PANEL WITH GFR
ALT: 12 IU/L (ref 0–32)
AST: 15 IU/L (ref 0–40)
Albumin: 4.2 g/dL (ref 3.9–4.9)
Alkaline Phosphatase: 49 IU/L (ref 41–116)
BUN/Creatinine Ratio: 11 (ref 9–23)
BUN: 10 mg/dL (ref 6–20)
Bilirubin Total: 0.6 mg/dL (ref 0.0–1.2)
CO2: 18 mmol/L — ABNORMAL LOW (ref 20–29)
Calcium: 9.1 mg/dL (ref 8.7–10.2)
Chloride: 107 mmol/L — ABNORMAL HIGH (ref 96–106)
Creatinine, Ser: 0.95 mg/dL (ref 0.57–1.00)
Globulin, Total: 3.4 g/dL (ref 1.5–4.5)
Glucose: 75 mg/dL (ref 70–99)
Potassium: 4.6 mmol/L (ref 3.5–5.2)
Sodium: 140 mmol/L (ref 134–144)
Total Protein: 7.6 g/dL (ref 6.0–8.5)
eGFR: 81 mL/min/1.73 (ref >59)

## 2024-01-14 LAB — RPR: RPR Ser Ql: NONREACTIVE

## 2024-01-14 LAB — HIV ANTIBODY (ROUTINE TESTING W REFLEX): HIV Screen 4th Generation wRfx: NONREACTIVE

## 2024-01-14 LAB — CBC
Hematocrit: 42.6 % (ref 34.0–46.6)
Hemoglobin: 13.5 g/dL (ref 11.1–15.9)
MCH: 29.9 pg (ref 26.6–33.0)
MCHC: 31.7 g/dL (ref 31.5–35.7)
MCV: 94 fL (ref 79–97)
Platelets: 339 x10E3/uL (ref 150–450)
RBC: 4.52 x10E6/uL (ref 3.77–5.28)
RDW: 13.3 % (ref 11.7–15.4)
WBC: 6.2 x10E3/uL (ref 3.4–10.8)

## 2024-01-14 LAB — LIPID PANEL W/O CHOL/HDL RATIO
Cholesterol, Total: 150 mg/dL (ref 100–199)
HDL: 64 mg/dL (ref >39)
LDL Chol Calc (NIH): 76 mg/dL (ref 0–99)
Triglycerides: 43 mg/dL (ref 0–149)
VLDL Cholesterol Cal: 10 mg/dL (ref 5–40)

## 2024-01-14 LAB — HEMOGLOBIN A1C
Est. average glucose Bld gHb Est-mCnc: 117 mg/dL
Hgb A1c MFr Bld: 5.7 % — ABNORMAL HIGH (ref 4.8–5.6)

## 2024-01-14 LAB — HEPATITIS C ANTIBODY: Hep C Virus Ab: NONREACTIVE

## 2024-01-14 LAB — HEPATITIS B SURFACE ANTIGEN: Hepatitis B Surface Ag: NEGATIVE

## 2024-01-16 ENCOUNTER — Encounter: Payer: Self-pay | Admitting: Obstetrics and Gynecology

## 2024-01-16 DIAGNOSIS — R7303 Prediabetes: Secondary | ICD-10-CM | POA: Insufficient documentation

## 2024-01-17 LAB — CERVICOVAGINAL ANCILLARY ONLY
Bacterial Vaginitis (gardnerella): NEGATIVE
Candida Glabrata: NEGATIVE
Candida Vaginitis: NEGATIVE
Comment: NEGATIVE
Comment: NEGATIVE
Comment: NEGATIVE
